# Patient Record
Sex: Male | Born: 1940 | Race: White | Hispanic: No | Marital: Married | Smoking: Former smoker
Health system: Southern US, Community
[De-identification: ages and names within clinical notes are randomized; demographics above are authoritative.]

## PROBLEM LIST (undated history)

## (undated) DIAGNOSIS — E785 Hyperlipidemia, unspecified: Secondary | ICD-10-CM

## (undated) DIAGNOSIS — M67442 Ganglion, left hand: Secondary | ICD-10-CM

## (undated) DIAGNOSIS — N400 Enlarged prostate without lower urinary tract symptoms: Secondary | ICD-10-CM

## (undated) HISTORY — DX: Benign prostatic hyperplasia without lower urinary tract symptoms: N40.0

## (undated) HISTORY — PX: TONSILLECTOMY: SUR1361

---

## 2014-08-30 ENCOUNTER — Emergency Department: Payer: Self-pay | Admitting: Emergency Medicine

## 2014-08-30 LAB — TROPONIN I

## 2014-08-30 LAB — URINALYSIS, COMPLETE
BLOOD: NEGATIVE
Bilirubin,UR: NEGATIVE
GLUCOSE, UR: NEGATIVE mg/dL (ref 0–75)
LEUKOCYTE ESTERASE: NEGATIVE
Nitrite: NEGATIVE
PH: 6 (ref 4.5–8.0)
PROTEIN: NEGATIVE
SPECIFIC GRAVITY: 1.021 (ref 1.003–1.030)
Squamous Epithelial: NONE SEEN
WBC UR: 4 /HPF (ref 0–5)

## 2014-08-30 LAB — CBC WITH DIFFERENTIAL/PLATELET
BASOS ABS: 0 10*3/uL (ref 0.0–0.1)
BASOS PCT: 0.5 %
EOS ABS: 0.1 10*3/uL (ref 0.0–0.7)
Eosinophil %: 0.8 %
HCT: 45.1 % (ref 40.0–52.0)
HGB: 14.9 g/dL (ref 13.0–18.0)
LYMPHS ABS: 1.3 10*3/uL (ref 1.0–3.6)
Lymphocyte %: 14.4 %
MCH: 29.4 pg (ref 26.0–34.0)
MCHC: 33 g/dL (ref 32.0–36.0)
MCV: 89 fL (ref 80–100)
MONO ABS: 0.7 x10 3/mm (ref 0.2–1.0)
MONOS PCT: 8 %
NEUTROS ABS: 7.1 10*3/uL — AB (ref 1.4–6.5)
Neutrophil %: 76.3 %
Platelet: 272 10*3/uL (ref 150–440)
RBC: 5.06 10*6/uL (ref 4.40–5.90)
RDW: 13.1 % (ref 11.5–14.5)
WBC: 9.3 10*3/uL (ref 3.8–10.6)

## 2014-08-30 LAB — BASIC METABOLIC PANEL
ANION GAP: 8 (ref 7–16)
BUN: 21 mg/dL — AB (ref 7–18)
CALCIUM: 8.2 mg/dL — AB (ref 8.5–10.1)
Chloride: 104 mmol/L (ref 98–107)
Co2: 27 mmol/L (ref 21–32)
Creatinine: 0.94 mg/dL (ref 0.60–1.30)
EGFR (African American): >60
Glucose: 113 mg/dL — ABNORMAL HIGH (ref 65–99)
OSMOLALITY: 281 (ref 275–301)
POTASSIUM: 3.8 mmol/L (ref 3.5–5.1)
SODIUM: 139 mmol/L (ref 136–145)

## 2015-02-21 ENCOUNTER — Other Ambulatory Visit: Payer: Self-pay | Admitting: Internal Medicine

## 2015-02-21 DIAGNOSIS — M25562 Pain in left knee: Secondary | ICD-10-CM

## 2015-03-01 ENCOUNTER — Ambulatory Visit
Admission: RE | Admit: 2015-03-01 | Discharge: 2015-03-01 | Disposition: A | Discharge status: Home / Self Care | Payer: Medicare Other | Source: Ambulatory Visit | Attending: Internal Medicine | Admitting: Internal Medicine

## 2015-03-01 DIAGNOSIS — S83242A Other tear of medial meniscus, current injury, left knee, initial encounter: Secondary | ICD-10-CM | POA: Diagnosis not present

## 2015-03-01 DIAGNOSIS — G8929 Other chronic pain: Secondary | ICD-10-CM | POA: Diagnosis present

## 2015-03-01 DIAGNOSIS — M25562 Pain in left knee: Secondary | ICD-10-CM

## 2015-03-01 DIAGNOSIS — M1712 Unilateral primary osteoarthritis, left knee: Secondary | ICD-10-CM | POA: Insufficient documentation

## 2015-03-01 DIAGNOSIS — S83282A Other tear of lateral meniscus, current injury, left knee, initial encounter: Secondary | ICD-10-CM | POA: Insufficient documentation

## 2015-09-14 ENCOUNTER — Ambulatory Visit: Payer: Medicare Other | Admitting: Anesthesiology

## 2015-09-14 ENCOUNTER — Ambulatory Visit
Admission: RE | Admit: 2015-09-14 | Discharge: 2015-09-14 | Disposition: A | Discharge status: Home / Self Care | Payer: Medicare Other | Source: Ambulatory Visit | Attending: Gastroenterology | Admitting: Gastroenterology

## 2015-09-14 ENCOUNTER — Encounter: Payer: Self-pay | Admitting: *Deleted

## 2015-09-14 ENCOUNTER — Encounter
Admission: RE | Disposition: A | Discharge status: Home / Self Care | Payer: Self-pay | Source: Ambulatory Visit | Attending: Gastroenterology

## 2015-09-14 DIAGNOSIS — K640 First degree hemorrhoids: Secondary | ICD-10-CM | POA: Insufficient documentation

## 2015-09-14 DIAGNOSIS — D120 Benign neoplasm of cecum: Secondary | ICD-10-CM | POA: Insufficient documentation

## 2015-09-14 DIAGNOSIS — Z79899 Other long term (current) drug therapy: Secondary | ICD-10-CM | POA: Insufficient documentation

## 2015-09-14 DIAGNOSIS — Z87891 Personal history of nicotine dependence: Secondary | ICD-10-CM | POA: Diagnosis not present

## 2015-09-14 DIAGNOSIS — Z1211 Encounter for screening for malignant neoplasm of colon: Secondary | ICD-10-CM | POA: Diagnosis present

## 2015-09-14 DIAGNOSIS — K5730 Diverticulosis of large intestine without perforation or abscess without bleeding: Secondary | ICD-10-CM | POA: Insufficient documentation

## 2015-09-14 DIAGNOSIS — E785 Hyperlipidemia, unspecified: Secondary | ICD-10-CM | POA: Insufficient documentation

## 2015-09-14 HISTORY — PX: COLONOSCOPY WITH PROPOFOL: SHX5780

## 2015-09-14 HISTORY — DX: Ganglion, left hand: M67.442

## 2015-09-14 HISTORY — DX: Hyperlipidemia, unspecified: E78.5

## 2015-09-14 SURGERY — COLONOSCOPY WITH PROPOFOL
Anesthesia: General

## 2015-09-14 MED ORDER — FENTANYL CITRATE (PF) 100 MCG/2ML IJ SOLN
INTRAMUSCULAR | Status: DC | PRN
  Administered 2015-09-14: 50 ug via INTRAVENOUS

## 2015-09-14 MED ORDER — PROPOFOL 500 MG/50ML IV EMUL
INTRAVENOUS | Status: DC | PRN
  Administered 2015-09-14: 140 ug/kg/min via INTRAVENOUS

## 2015-09-14 MED ORDER — PROPOFOL 500 MG/50ML IV EMUL: INTRAVENOUS | Status: DC | PRN

## 2015-09-14 MED ORDER — SODIUM CHLORIDE 0.9 % IV SOLN
INTRAVENOUS | Status: DC
  Administered 2015-09-14: 1000 mL via INTRAVENOUS
  Administered 2015-09-14: 08:00:00 via INTRAVENOUS

## 2015-09-14 MED ORDER — MIDAZOLAM HCL 5 MG/5ML IJ SOLN
INTRAMUSCULAR | Status: DC | PRN
  Administered 2015-09-14: 1 mg via INTRAVENOUS

## 2015-09-14 MED ORDER — PROPOFOL 10 MG/ML IV BOLUS
INTRAVENOUS | Status: DC | PRN
  Administered 2015-09-14: 30 mg via INTRAVENOUS

## 2015-09-14 NOTE — Anesthesia Postprocedure Evaluation (Signed)
Anesthesia Post Note  Patient: Dalton Franklin  Procedure(s) Performed: Procedure(s) (LRB): COLONOSCOPY WITH PROPOFOL (N/A)  Patient location during evaluation: PACU Anesthesia Type: General Level of consciousness: awake Pain management: pain level controlled Vital Signs Assessment: post-procedure vital signs reviewed and stable Respiratory status: spontaneous breathing Cardiovascular status: stable Anesthetic complications: no    Last Vitals:  Filed Vitals:   09/14/15 0850 09/14/15 0900  BP: 110/76 118/70  Pulse: 66 64  Temp:    Resp: 13 13    Last Pain: There were no vitals filed for this visit.               VAN STAVEREN,Dalton Franklin

## 2015-09-14 NOTE — Anesthesia Procedure Notes (Signed)
Date/Time: 09/14/2015 8:05 AM Performed by: Allean Found Pre-anesthesia Checklist: Patient identified, Emergency Drugs available, Suction available, Patient being monitored and Timeout performed Oxygen Delivery Method: Nasal cannula

## 2015-09-14 NOTE — Op Note (Signed)
[facility] Gastroenterology Patient Name: Dalton Franklin Procedure Date: 09/14/2015 8:08 AM MRN: [id number] Account #: [id number] Date of Birth: 12-12-1940 Admit Type: Outpatient Age: 74 Room: [facility] ENDO ROOM 3 Gender: Male Note Status: Finalized Procedure:         Colonoscopy Indications:       High risk colon cancer surveillance: Personal history of                     non-advanced adenoma, Last colonoscopy: 2011 Patient Profile:   This is a 74 year old male. Providers:         Gerrit Heck. Rayann Heman, MD Referring MD:      Leonie Douglas. Doy Hutching, MD (Referring MD) Medicines:         Propofol per Anesthesia Complications:     No immediate complications. Procedure:         Pre-Anesthesia Assessment:                    - Prior to the procedure, a History and Physical was                     performed, and patient medications, allergies and                     sensitivities were reviewed. The patient's tolerance of                     previous anesthesia was reviewed.                    - Prior to the procedure, a History and Physical was                     performed, and patient medications, allergies and                     sensitivities were reviewed. The patient's tolerance of                     previous anesthesia was reviewed.                    After obtaining informed consent, the colonoscope was                     passed under direct vision. Throughout the procedure, the                     patient's blood pressure, pulse, and oxygen saturations                     were monitored continuously. The Colonoscope was                     introduced through the anus and advanced to the the cecum,                     identified by appendiceal orifice and ileocecal valve. The                     colonoscopy was performed without difficulty. The patient                     tolerated the procedure well. The quality of the bowel  preparation was  excellent. Findings:      The perianal and digital rectal examinations were normal.      A 4 mm polyp was found in the cecum. The polyp was sessile. The polyp       was removed with a cold snare. Resection and retrieval were complete.      A few small and large-mouthed diverticula were found in the sigmoid       colon.      Internal hemorrhoids were found during retroflexion. The hemorrhoids       were Grade I (internal hemorrhoids that do not prolapse).      The exam was otherwise without abnormality. Impression:        - One 4 mm polyp in the cecum. Resected and retrieved.                    - Diverticulosis in the sigmoid colon.                    - Internal hemorrhoids.                    - The examination was otherwise normal. Recommendation:    - Observe patient in GI recovery unit.                    - Continue present medications.                    - Await pathology results.                    - Repeat colonoscopy in 5 years for surveillance.                    - Return to referring physician.                    - The findings and recommendations were discussed with the                     patient.                    - The findings and recommendations were discussed with the                     patient's family. Procedure Code(s): --- Professional ---                    [phone], Colonoscopy, flexible; with removal of tumor(s),                     polyp(s), or other lesion(s) by snare technique Diagnosis Code(s): --- Professional ---                    Z86.010, Personal history of colonic polyps                    D12.0, Benign neoplasm of cecum                    K64.0, First degree hemorrhoids                    K57.30, Diverticulosis of large intestine without                     perforation or abscess without bleeding  CPT copyright 2014 American Medical Association. All rights reserved. The codes documented in this report are preliminary and upon coder review may  be  revised to meet current compliance requirements. Mellody Life, MD 09/14/2015 8:36:46 AM This report has been signed electronically. Number of Addenda: 0 Note Initiated On: 09/14/2015 8:08 AM Scope Withdrawal Time: 0 hours 13 minutes 7 seconds  Total Procedure Duration: 0 hours 17 minutes 23 seconds       [facility]

## 2015-09-14 NOTE — Discharge Instructions (Signed)

## 2015-09-14 NOTE — Anesthesia Postprocedure Evaluation (Signed)
Anesthesia Post Note  Patient: Dalton Franklin  Procedure(s) Performed: Procedure(s) (LRB): COLONOSCOPY WITH PROPOFOL (N/A)  Patient location during evaluation: PACU Anesthesia Type: General Level of consciousness: awake Pain management: satisfactory to patient Vital Signs Assessment: post-procedure vital signs reviewed and stable Respiratory status: spontaneous breathing Cardiovascular status: stable Anesthetic complications: no    Last Vitals:  Filed Vitals:   09/14/15 0850 09/14/15 0900  BP: 110/76 118/70  Pulse: 66 64  Temp:    Resp: 13 13    Last Pain: There were no vitals filed for this visit.               VAN STAVEREN,Arriana Lohmann

## 2015-09-14 NOTE — Transfer of Care (Signed)
Immediate Anesthesia Transfer of Care Note  Patient: Dalton Franklin  Procedure(s) Performed: Procedure(s): COLONOSCOPY WITH PROPOFOL (N/A)  Patient Location: PACU  Anesthesia Type:General  Level of Consciousness: sedated  Airway & Oxygen Therapy: Patient Spontanous Breathing and Patient connected to nasal cannula oxygen  Post-op Assessment: Report given to RN and Post -op Vital signs reviewed and stable  Post vital signs: Reviewed and stable  Last Vitals:  Filed Vitals:   09/14/15 0730  BP: 157/90  Pulse: 81  Temp: 36.7 C  Resp: 18    Complications: No apparent anesthesia complications

## 2015-09-14 NOTE — H&P (Signed)
  Primary Care Physician:  Idelle Crouch, MD  Pre-Procedure History & Physical: HPI:  Dalton Franklin is a 74 y.o. male is here for an colonoscopy.   Past Medical History  Diagnosis Date  . Hyperlipidemia   . Ganglion cyst of flexor tendon sheath of finger of left hand     Past Surgical History  Procedure Laterality Date  . Tonsillectomy      Prior to Admission medications   Medication Sig Start Date End Date Taking? Authorizing Provider  atorvastatin (LIPITOR) 20 MG tablet Take 20 mg by mouth daily.   Yes Historical Provider, MD  ibuprofen (ADVIL,MOTRIN) 200 MG tablet Take 200 mg by mouth every 6 (six) hours as needed.   Yes Historical Provider, MD  niacin (NIASPAN) 1000 MG CR tablet Take 1,000 mg by mouth at bedtime.   Yes Historical Provider, MD    Allergies as of 08/14/2015  . (Not on File)    History reviewed. No pertinent family history.  Social History   Social History  . Marital Status: Married    Spouse Name: N/A  . Number of Children: N/A  . Years of Education: N/A   Occupational History  . Not on file.   Social History Main Topics  . Smoking status: Former Research scientist (life sciences)  . Smokeless tobacco: Not on file  . Alcohol Use: Not on file  . Drug Use: Not on file  . Sexual Activity: Not on file   Other Topics Concern  . Not on file   Social History Narrative  . No narrative on file     Physical Exam: BP 157/90 mmHg  Pulse 81  Temp(Src) 98.1 F (36.7 C) (Oral)  Resp 18  Ht 5\' 11"  (1.803 m)  Wt 83.915 kg (185 lb)  BMI 25.81 kg/m2  SpO2 97% General:   Alert,  pleasant and cooperative in NAD Head:  Normocephalic and atraumatic. Neck:  Supple; no masses or thyromegaly. Lungs:  Clear throughout to auscultation.    Heart:  Regular rate and rhythm. Abdomen:  Soft, nontender and nondistended. Normal bowel sounds, without guarding, and without rebound.   Neurologic:  Alert and  oriented x4;  grossly normal neurologically.  Impression/Plan: Dalton Franklin  is here for an colonoscopy to be performed for polyp surveillance  Risks, benefits, limitations, and alternatives regarding  colonoscopy have been reviewed with the patient.  Questions have been answered.  All parties agreeable.   Josefine Class, MD  09/14/2015, 8:06 AM

## 2015-09-14 NOTE — Anesthesia Preprocedure Evaluation (Signed)
Anesthesia Evaluation  Patient identified by MRN, date of birth, ID band Patient awake    Reviewed: Allergy & Precautions, NPO status , Patient's Chart, lab work & pertinent test results  Airway Mallampati: II       Dental  (+) Teeth Intact   Pulmonary neg pulmonary ROS, former smoker,    Pulmonary exam normal        Cardiovascular negative cardio ROS   Rhythm:Regular     Neuro/Psych    GI/Hepatic negative GI ROS, Neg liver ROS,   Endo/Other  negative endocrine ROS  Renal/GU negative Renal ROS     Musculoskeletal   Abdominal Normal abdominal exam  (+)   Peds negative pediatric ROS (+)  Hematology negative hematology ROS (+)   Anesthesia Other Findings   Reproductive/Obstetrics                             Anesthesia Physical Anesthesia Plan  ASA: I  Anesthesia Plan: General   Post-op Pain Management:    Induction: Intravenous  Airway Management Planned: Nasal Cannula  Additional Equipment:   Intra-op Plan:   Post-operative Plan:   Informed Consent: I have reviewed the patients History and Physical, chart, labs and discussed the procedure including the risks, benefits and alternatives for the proposed anesthesia with the patient or authorized representative who has indicated his/her understanding and acceptance.     Plan Discussed with: CRNA  Anesthesia Plan Comments:         Anesthesia Quick Evaluation

## 2015-09-17 ENCOUNTER — Encounter: Payer: Self-pay | Admitting: Gastroenterology

## 2015-09-17 LAB — SURGICAL PATHOLOGY

## 2015-10-26 IMAGING — CR ORBITS FOR FOREIGN BODY - 2 VIEW
1 series · 2 of 2 positions shown · non-contrast
Comparison: None.

CLINICAL DATA: Metal working/exposure; clearance prior to MRI

EXAM:
ORBITS FOR FOREIGN BODY - 2 VIEW

[Series 1: dxr orbits for mri clearance · 0.14mm/px · 2 of 2 slices shown]
[im 1/2]
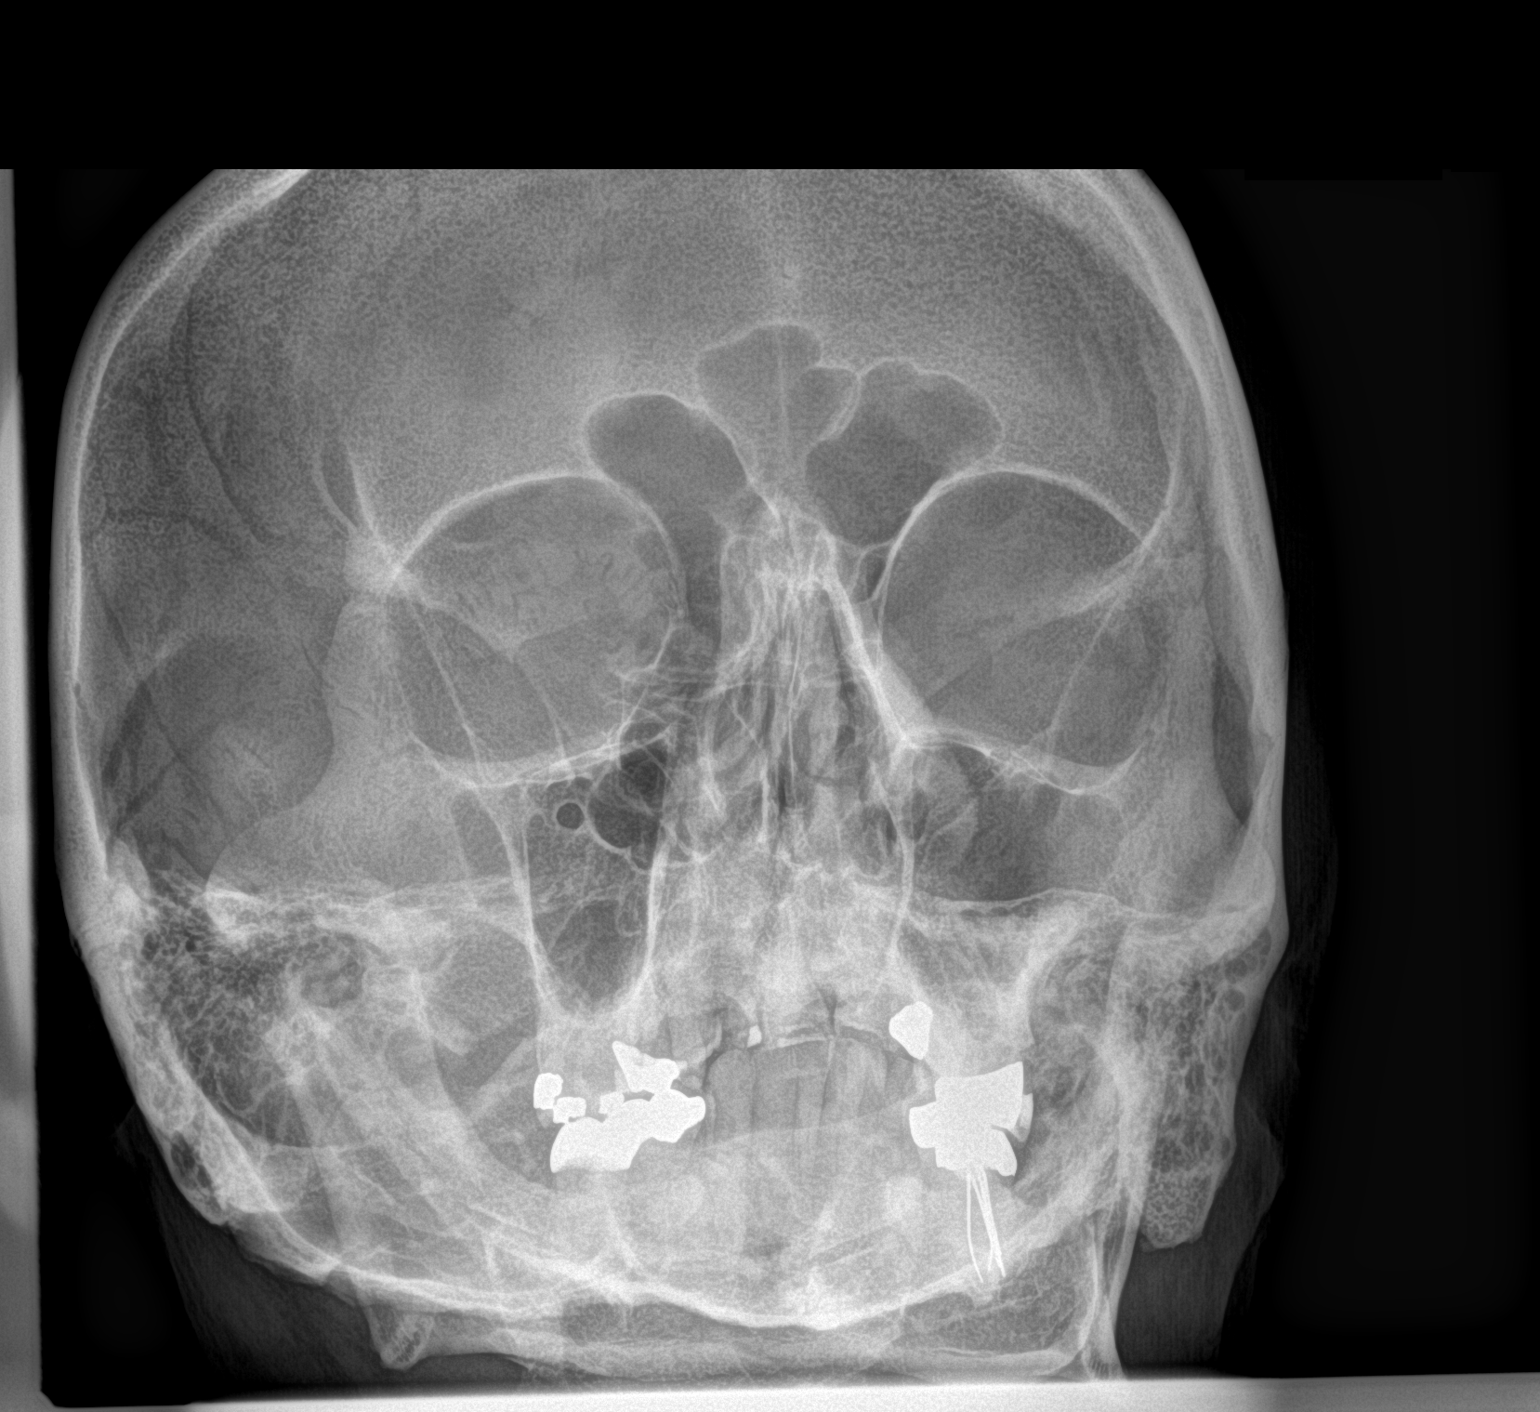
[im 2/2]
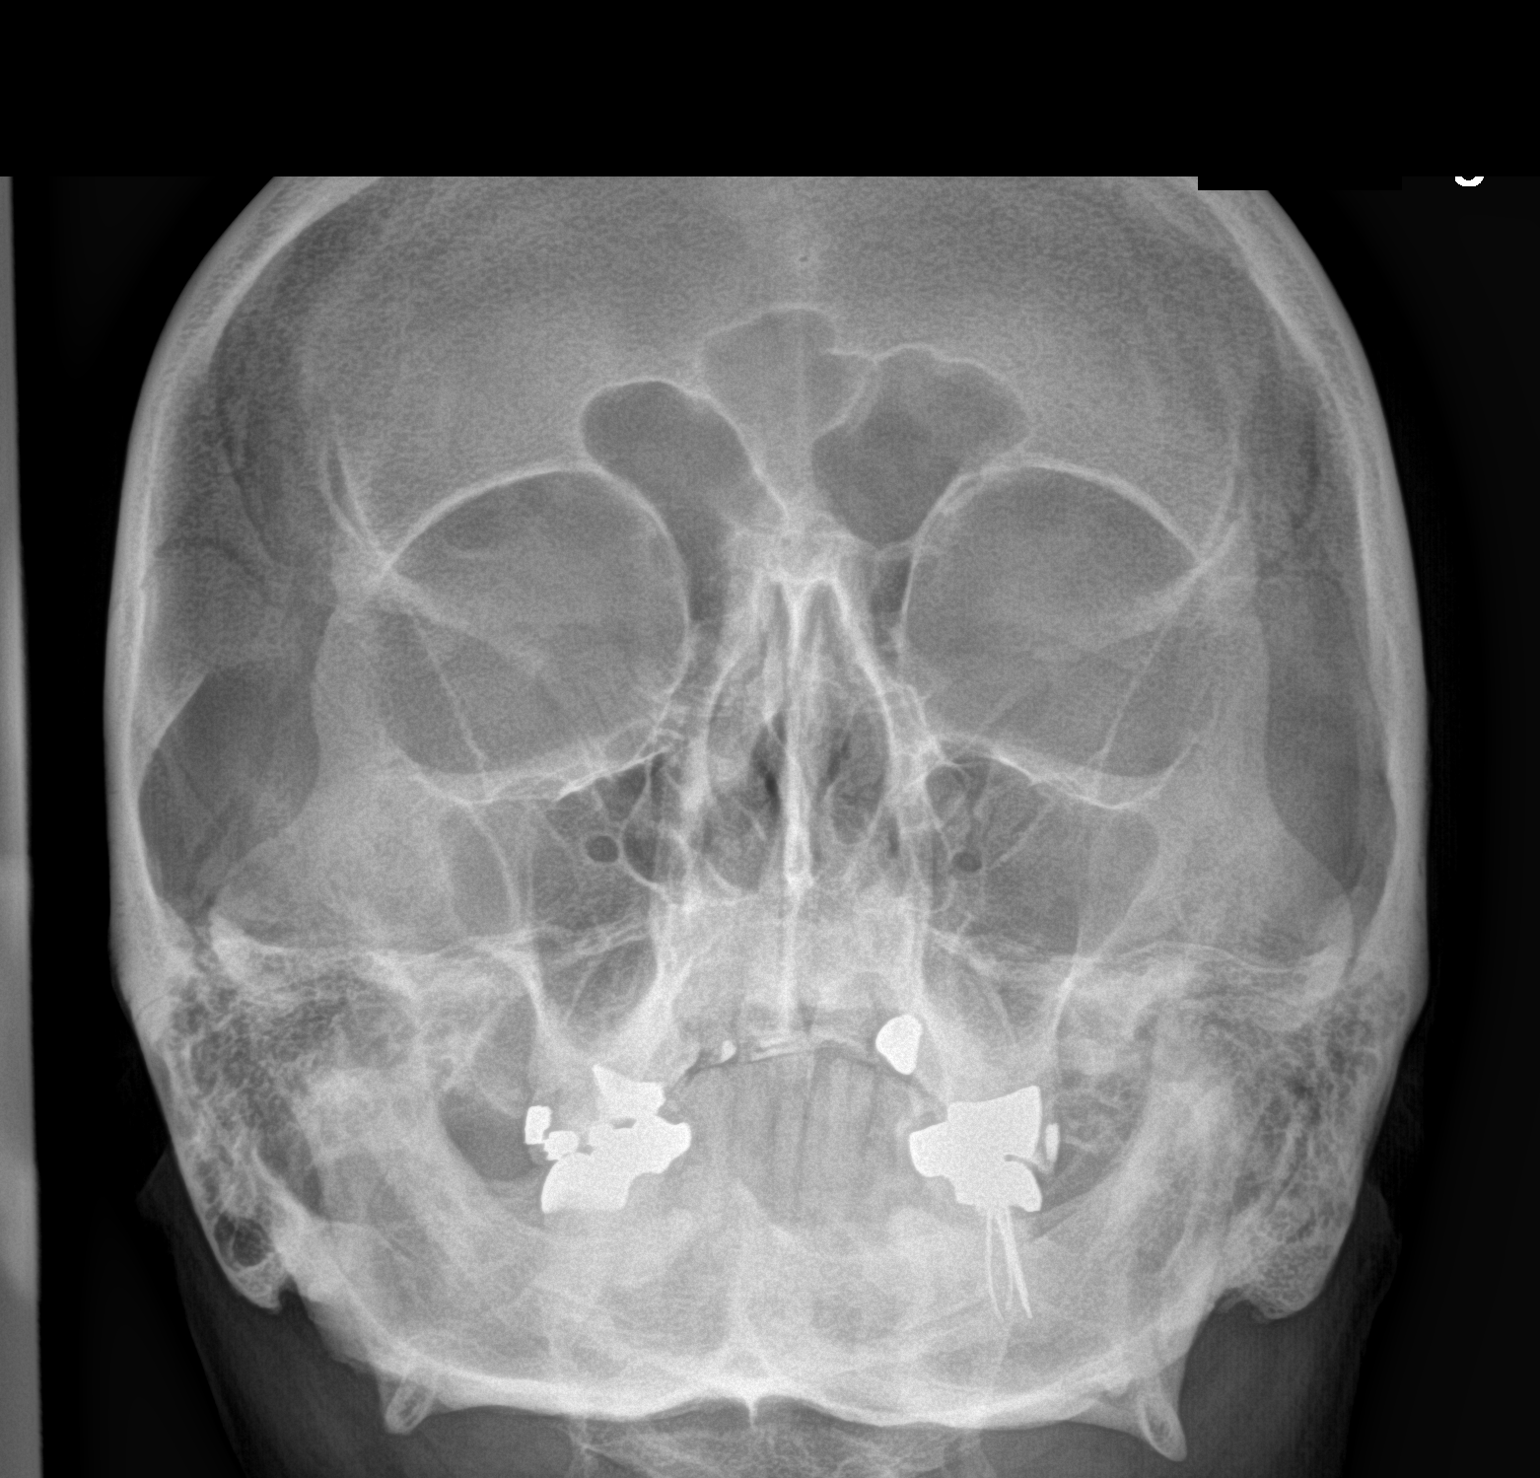

[2 of 2 positions shown; findings below may reference images not displayed]

FINDINGS: There is no evidence of metallic foreign body within the orbits. No
significant bone abnormality identified.
IMPRESSION: No evidence of metallic foreign body within the orbits.

## 2016-07-25 DIAGNOSIS — M75122 Complete rotator cuff tear or rupture of left shoulder, not specified as traumatic: Secondary | ICD-10-CM | POA: Insufficient documentation

## 2016-07-25 DIAGNOSIS — M7582 Other shoulder lesions, left shoulder: Secondary | ICD-10-CM | POA: Insufficient documentation

## 2016-08-31 ENCOUNTER — Emergency Department
Admission: EM | Admit: 2016-08-31 | Discharge: 2016-08-31 | Disposition: A | Discharge status: Home / Self Care | Payer: Medicare Other | Attending: Emergency Medicine | Admitting: Emergency Medicine

## 2016-08-31 DIAGNOSIS — Z791 Long term (current) use of non-steroidal anti-inflammatories (NSAID): Secondary | ICD-10-CM | POA: Diagnosis not present

## 2016-08-31 DIAGNOSIS — R339 Retention of urine, unspecified: Secondary | ICD-10-CM | POA: Insufficient documentation

## 2016-08-31 DIAGNOSIS — Z87891 Personal history of nicotine dependence: Secondary | ICD-10-CM | POA: Diagnosis not present

## 2016-08-31 DIAGNOSIS — R1030 Lower abdominal pain, unspecified: Secondary | ICD-10-CM | POA: Diagnosis not present

## 2016-08-31 LAB — URINALYSIS COMPLETE WITH MICROSCOPIC (ARMC ONLY)
BACTERIA UA: NONE SEEN
Bilirubin Urine: NEGATIVE
Glucose, UA: NEGATIVE mg/dL
Hgb urine dipstick: NEGATIVE
Leukocytes, UA: NEGATIVE
Nitrite: NEGATIVE
PROTEIN: NEGATIVE mg/dL
Specific Gravity, Urine: 1.012 (ref 1.005–1.030)
Squamous Epithelial / LPF: NONE SEEN
pH: 5 (ref 5.0–8.0)

## 2016-08-31 LAB — CBC WITH DIFFERENTIAL/PLATELET
BASOS ABS: 0 10*3/uL (ref 0–0.1)
Basophils Relative: 0 %
Eosinophils Absolute: 0.2 10*3/uL (ref 0–0.7)
Eosinophils Relative: 2 %
HEMATOCRIT: 43.3 % (ref 40.0–52.0)
HEMOGLOBIN: 15 g/dL (ref 13.0–18.0)
LYMPHS PCT: 13 %
Lymphs Abs: 1.3 10*3/uL (ref 1.0–3.6)
MCH: 29.4 pg (ref 26.0–34.0)
MCHC: 34.6 g/dL (ref 32.0–36.0)
MCV: 85.1 fL (ref 80.0–100.0)
Monocytes Absolute: 1.3 10*3/uL — ABNORMAL HIGH (ref 0.2–1.0)
Monocytes Relative: 12 %
NEUTROS ABS: 7.5 10*3/uL — AB (ref 1.4–6.5)
NEUTROS PCT: 73 %
Platelets: 224 10*3/uL (ref 150–440)
RBC: 5.09 MIL/uL (ref 4.40–5.90)
RDW: 13.4 % (ref 11.5–14.5)
WBC: 10.4 10*3/uL (ref 3.8–10.6)

## 2016-08-31 LAB — BASIC METABOLIC PANEL
ANION GAP: 7 (ref 5–15)
BUN: 21 mg/dL — ABNORMAL HIGH (ref 6–20)
CO2: 24 mmol/L (ref 22–32)
Calcium: 9.6 mg/dL (ref 8.9–10.3)
Chloride: 104 mmol/L (ref 101–111)
Creatinine, Ser: 1.17 mg/dL (ref 0.61–1.24)
GFR calc Af Amer: >60 mL/min (ref >60)
GFR calc non Af Amer: 59 mL/min — ABNORMAL LOW (ref >60)
GLUCOSE: 77 mg/dL (ref 65–99)
POTASSIUM: 4.2 mmol/L (ref 3.5–5.1)
Sodium: 135 mmol/L (ref 135–145)

## 2016-08-31 MED ORDER — TAMSULOSIN HCL 0.4 MG PO CAPS: 0.4000 mg | ORAL_CAPSULE | Freq: Every day | ORAL | 0 refills | Status: DC

## 2016-08-31 NOTE — ED Notes (Signed)
Patient reports that he is having trouble with urination. Patient states that he is only "dribbleing" when he uses the bathroom. Patient recently taking decongestants for cold symptoms.

## 2016-08-31 NOTE — ED Provider Notes (Signed)
[facility] Emergency Department Provider Note  ____________________________________________   First MD Initiated Contact with Patient 08/31/16 [phone]     (approximate)  I have reviewed the triage vital signs and the nursing notes.   HISTORY  Chief Complaint Dysuria and Flank Pain   HPI Dalton Franklin is a 75 y.o. male with a history of hyperlipidemia who is presenting to the emergency department with difficulty with urination. He says that he has only been able to "dribble" urine since this Friday after taking Claritin-D. He says that something similar happen to him about a month ago after taking Mucinex. He says that he feels a fullness to his lower abdomen which has become increasingly painful. He denies any flank pain to me. Denies any fever. Denies any nausea vomiting or diarrhea. Denies any history of enlarged prostate and says that he has had a recent workup by his primary care doctor with a normal rectal exam as well as a low PSA.   Past Medical History:  Diagnosis Date  . Ganglion cyst of flexor tendon sheath of finger of left hand   . Hyperlipidemia     There are no active problems to display for this patient.   Past Surgical History:  Procedure Laterality Date  . COLONOSCOPY WITH PROPOFOL N/A 09/14/2015   Procedure: COLONOSCOPY WITH PROPOFOL;  Surgeon: Josefine Class, MD;  Location: [facility] ENDOSCOPY;  Service: Endoscopy;  Laterality: N/A;  . TONSILLECTOMY      Prior to Admission medications   Medication Sig Start Date End Date Taking? Authorizing Provider  atorvastatin (LIPITOR) 20 MG tablet Take 20 mg by mouth daily.    Historical Provider, MD  ibuprofen (ADVIL,MOTRIN) 200 MG tablet Take 200 mg by mouth every 6 (six) hours as needed.    Historical Provider, MD  niacin (NIASPAN) 1000 MG CR tablet Take 1,000 mg by mouth at bedtime.    Historical Provider, MD    Allergies Meloxicam; Other; and Soma [carisoprodol]  No family history on  file.  Social History Social History  Substance Use Topics  . Smoking status: Former Research scientist (life sciences)  . Smokeless tobacco: Not on file  . Alcohol use Not on file    Review of Systems Constitutional: No fever/chills Eyes: No visual changes. ENT: No sore throat. Cardiovascular: Denies chest pain. Respiratory: Denies shortness of breath. Gastrointestinal:   No nausea, no vomiting.  No diarrhea.  No constipation. Genitourinary: As above Musculoskeletal: Negative for back pain. Skin: Negative for rash. Neurological: Negative for headaches, focal weakness or numbness.  10-point ROS otherwise negative.  ____________________________________________   PHYSICAL EXAM:  VITAL SIGNS: ED Triage Vitals  Enc Vitals Group     BP 08/31/16 0908 (!) 152/89     Pulse Rate 08/31/16 0908 92     Resp 08/31/16 0908 18     Temp 08/31/16 0908 97.5 F (36.4 C)     Temp src --      SpO2 08/31/16 0908 97 %     Weight 08/31/16 0910 185 lb (83.9 kg)     Height 08/31/16 0910 5\' 11"  (1.803 m)     Head Circumference --      Peak Flow --      Pain Score 08/31/16 0913 8     Pain Loc --      Pain Edu? --      Excl. in [place]? --     Constitutional: Alert and oriented. Well appearing and in no acute distress. Eyes: Conjunctivae are normal.  PERRL. EOMI. Head: Atraumatic. Nose: No congestion/rhinnorhea. Mouth/Throat: Mucous membranes are moist.   Neck: No stridor.   Cardiovascular: Normal rate, regular rhythm. Grossly normal heart sounds.   Respiratory: Normal respiratory effort.  No retractions. Lungs CTAB. Gastrointestinal: Soft With suprapubic fullness as well as mild tenderness palpation. No distention. No CVA tenderness. Musculoskeletal: No lower extremity tenderness nor edema.   Neurologic:  Normal speech and language. No gross focal neurologic deficits are appreciated.  Skin:  Skin is warm, dry and intact. No rash noted. Psychiatric: Mood and affect are normal. Speech and behavior are  normal.  ____________________________________________   LABS (all labs ordered are listed, but only abnormal results are displayed)  Labs Reviewed  URINALYSIS COMPLETEWITH MICROSCOPIC ([place]) - Abnormal; Notable for the following:       Result Value   Color, Urine YELLOW (*)    APPearance CLEAR (*)    Ketones, ur TRACE (*)    All other components within normal limits  CBC WITH DIFFERENTIAL/PLATELET - Abnormal; Notable for the following:    Neutro Abs 7.5 (*)    Monocytes Absolute 1.3 (*)    All other components within normal limits  BASIC METABOLIC PANEL - Abnormal; Notable for the following:    BUN 21 (*)    GFR calc non Af Amer 59 (*)    All other components within normal limits   ____________________________________________  EKG   ____________________________________________  RADIOLOGY   ____________________________________________   PROCEDURES  Procedure(s) performed:   Procedures  Critical Care performed:   ____________________________________________   INITIAL IMPRESSION / ASSESSMENT AND PLAN / ED COURSE  Pertinent labs & imaging results that were available during my care of the patient were reviewed by me and considered in my medical decision making (see chart for details).   Clinical Course     Patient likely with urinary retention secondary antihistamine use. I discussed with him using a nasal steroid if he has further congestion such as Flonase. ____________________________________________  ----------------------------------------- 10:44 AM on 08/31/2016 -----------------------------------------  Patient with very quick return of 700 cc of urine through the Foley. He says that he feels much improved. Reassuring labs without any signs of kidney injury. Normal white blood cell count. We'll discharge with Flomax. He understands follow up with urologist in about one week for removal of the catheter.    FINAL CLINICAL IMPRESSION(S) / ED  DIAGNOSES  Urinary retention secondary to antihistamines.    NEW MEDICATIONS STARTED DURING THIS VISIT:  New Prescriptions   No medications on file     Note:  This document was prepared using Dragon voice recognition software and may include unintentional dictation errors.    Orbie Pyo, MD 08/31/16 1046

## 2016-08-31 NOTE — ED Triage Notes (Signed)
Pt reports frequency, urgency, dribbling urine and flank pain worse upon standing for several days. Pt denies fever at home. Denies nausea, vomiting or diarrhea.

## 2016-08-31 NOTE — ED Notes (Signed)
Bladder scan = >999 mL  Dr. Clearnce Hasten notified

## 2016-10-03 ENCOUNTER — Encounter: Payer: Self-pay | Admitting: Urology

## 2016-10-03 ENCOUNTER — Telehealth: Payer: Self-pay

## 2016-10-03 ENCOUNTER — Ambulatory Visit (INDEPENDENT_AMBULATORY_CARE_PROVIDER_SITE_OTHER): Payer: Medicare Other | Admitting: Urology

## 2016-10-03 VITALS — BP 149/92 | HR 71 | Ht 71.0 in | Wt 195.9 lb

## 2016-10-03 DIAGNOSIS — E559 Vitamin D deficiency, unspecified: Secondary | ICD-10-CM | POA: Insufficient documentation

## 2016-10-03 DIAGNOSIS — N401 Enlarged prostate with lower urinary tract symptoms: Secondary | ICD-10-CM

## 2016-10-03 DIAGNOSIS — M6740 Ganglion, unspecified site: Secondary | ICD-10-CM | POA: Insufficient documentation

## 2016-10-03 DIAGNOSIS — R338 Other retention of urine: Secondary | ICD-10-CM

## 2016-10-03 DIAGNOSIS — M25512 Pain in left shoulder: Secondary | ICD-10-CM

## 2016-10-03 DIAGNOSIS — Z86010 Personal history of colonic polyps: Secondary | ICD-10-CM | POA: Insufficient documentation

## 2016-10-03 DIAGNOSIS — R339 Retention of urine, unspecified: Secondary | ICD-10-CM

## 2016-10-03 DIAGNOSIS — G8929 Other chronic pain: Secondary | ICD-10-CM | POA: Insufficient documentation

## 2016-10-03 DIAGNOSIS — E785 Hyperlipidemia, unspecified: Secondary | ICD-10-CM | POA: Insufficient documentation

## 2016-10-03 LAB — URINALYSIS, COMPLETE
Bilirubin, UA: NEGATIVE
Glucose, UA: NEGATIVE
Ketones, UA: NEGATIVE
Leukocytes, UA: NEGATIVE
NITRITE UA: NEGATIVE
PH UA: 5.5 (ref 5.0–7.5)
PROTEIN UA: NEGATIVE
RBC, UA: NEGATIVE
Specific Gravity, UA: 1.015 (ref 1.005–1.030)
UUROB: 0.2 mg/dL (ref 0.2–1.0)

## 2016-10-03 LAB — MICROSCOPIC EXAMINATION: Epithelial Cells (non renal): NONE SEEN /hpf (ref 0–10)

## 2016-10-03 LAB — BLADDER SCAN AMB NON-IMAGING: SCAN RESULT: 870

## 2016-10-03 MED ORDER — FINASTERIDE 5 MG PO TABS: 5.0000 mg | ORAL_TABLET | Freq: Every day | ORAL | 11 refills | Status: DC

## 2016-10-03 MED ORDER — TAMSULOSIN HCL 0.4 MG PO CAPS: 0.8000 mg | ORAL_CAPSULE | Freq: Every day | ORAL | 11 refills | Status: DC

## 2016-10-03 NOTE — Telephone Encounter (Signed)
Pt called stating he just had a catheter placed just a few minutes ago and now is having blood in his bag. Reinforced with pt this is normal and to drink more water. Reinforced with pt the more water drank the less blood he should see. Pt voiced understanding.

## 2016-10-03 NOTE — Progress Notes (Signed)
10/03/2016 9:38 AM   Dalton Franklin 1940/12/22 [id number]  Referring provider: Idelle Crouch, MD [facility] [place], [place] [address]  Chief Complaint  Patient presents with  . New Patient (Initial Visit)    urinary retention     HPI: The patient is a 75 year old man who has a history of urinary retention who presents with a chief complaint of not be able to control his urination. He was seen in the emergency department approximately one month ago when a Foley catheter was placed for 700 cc. This was removed his primary care provider's office a few days later.  He presents today with a PVR of 870 cc.  His only complaint is occasional incontinence. He wears a depends for this. It happens mostly at night. He feels that he empties his bladder and is currently in no discomfort. He states that his stream is strong. He denies hesitancy or urgency.   PMH: Past Medical History:  Diagnosis Date  . Ganglion cyst of flexor tendon sheath of finger of left hand   . Hyperlipidemia     Surgical History: Past Surgical History:  Procedure Laterality Date  . COLONOSCOPY WITH PROPOFOL N/A 09/14/2015   Procedure: COLONOSCOPY WITH PROPOFOL;  Surgeon: Josefine Class, MD;  Location: [facility] ENDOSCOPY;  Service: Endoscopy;  Laterality: N/A;  . TONSILLECTOMY      Home Medications:  Allergies as of 10/03/2016      Reactions   Meloxicam    Other    Antihistamines:  Urinary retention   Soma [carisoprodol]       Medication List       Accurate as of 10/03/16  9:38 AM. Always use your most recent med list.          atorvastatin 20 MG tablet Commonly known as:  LIPITOR Take 20 mg by mouth daily.   finasteride 5 MG tablet Commonly known as:  PROSCAR Take 1 tablet (5 mg total) by mouth daily.   ibuprofen 200 MG tablet Commonly known as:  ADVIL,MOTRIN Take 200 mg by mouth every 6 (six) hours as needed.   niacin 1000 MG CR tablet Commonly known as:   NIASPAN Take 1,000 mg by mouth at bedtime.   tamsulosin 0.4 MG Caps capsule Commonly known as:  FLOMAX Take 1 capsule (0.4 mg total) by mouth daily.   tamsulosin 0.4 MG Caps capsule Commonly known as:  FLOMAX Take 2 capsules (0.8 mg total) by mouth daily.       Allergies:  Allergies  Allergen Reactions  . Meloxicam   . Other     Antihistamines:  Urinary retention   . Soma [Carisoprodol]     Family History: Family History  Problem Relation Age of Onset  . Prostate cancer Neg Hx   . Kidney cancer Neg Hx   . Bladder Cancer Neg Hx     Social History:  reports that he has quit smoking. He has never used smokeless tobacco. He reports that he does not drink alcohol or use drugs.  ROS: UROLOGY Frequent Urination?: Yes Hard to postpone urination?: Yes Burning/pain with urination?: No Get up at night to urinate?: Yes Leakage of urine?: Yes Urine stream starts and stops?: No Trouble starting stream?: No Do you have to strain to urinate?: No Blood in urine?: No Urinary tract infection?: No Sexually transmitted disease?: No Injury to kidneys or bladder?: No Painful intercourse?: No Weak stream?: No Erection problems?: No Penile pain?: No  Gastrointestinal Nausea?: No  Vomiting?: No Indigestion/heartburn?: No Diarrhea?: No Constipation?: No  Constitutional Fever: No Night sweats?: No Weight loss?: No Fatigue?: No  Skin Skin rash/lesions?: No Itching?: No  Eyes Blurred vision?: No Double vision?: No  Ears/Nose/Throat Sore throat?: No Sinus problems?: No  Hematologic/Lymphatic Swollen glands?: No Easy bruising?: No  Cardiovascular Leg swelling?: No Chest pain?: No  Respiratory Cough?: No Shortness of breath?: No  Endocrine Excessive thirst?: No  Musculoskeletal Back pain?: No Joint pain?: No  Neurological Headaches?: No Dizziness?: No  Psychologic Depression?: No Anxiety?: No  Physical Exam: BP (!) 149/92   Pulse 71   Ht 5'  11" (1.803 m)   Wt 195 lb 14.4 oz (88.9 kg)   BMI 27.32 kg/m   Constitutional:  Alert and oriented, No acute distress. HEENT: [place] AT, moist mucus membranes.  Trachea midline, no masses. Cardiovascular: No clubbing, cyanosis, or edema. Respiratory: Normal respiratory effort, no increased work of breathing. GI: Abdomen is soft, nontender, nondistended, no abdominal masses GU: No CVA tenderness. Normal phallus. Testicles descended equally bilaterally benign. DRE: 2+ benign. Skin: No rashes, bruises or suspicious lesions. Lymph: No cervical or inguinal adenopathy. Neurologic: Grossly intact, no focal deficits, moving all 4 extremities. Psychiatric: Normal mood and affect.  Laboratory Data: Lab Results  Component Value Date   WBC 10.4 08/31/2016   HGB 15.0 08/31/2016   HCT 43.3 08/31/2016   MCV 85.1 08/31/2016   PLT 224 08/31/2016    Lab Results  Component Value Date   CREATININE 1.17 08/31/2016    No results found for: PSA  No results found for: TESTOSTERONE  No results found for: HGBA1C  Urinalysis    Component Value Date/Time   COLORURINE YELLOW (A) 08/31/2016 0915   APPEARANCEUR CLEAR (A) 08/31/2016 0915   APPEARANCEUR Clear 08/30/2014 1637   LABSPEC 1.012 08/31/2016 0915   LABSPEC 1.021 08/30/2014 1637   PHURINE 5.0 08/31/2016 0915   GLUCOSEU NEGATIVE 08/31/2016 0915   GLUCOSEU Negative 08/30/2014 1637   HGBUR NEGATIVE 08/31/2016 0915   BILIRUBINUR NEGATIVE 08/31/2016 0915   BILIRUBINUR Negative 08/30/2014 1637   KETONESUR TRACE (A) 08/31/2016 0915   PROTEINUR NEGATIVE 08/31/2016 0915   NITRITE NEGATIVE 08/31/2016 0915   LEUKOCYTESUR NEGATIVE 08/31/2016 0915   LEUKOCYTESUR Negative 08/30/2014 1637     Assessment & Plan:    1. BPH 2. Urinary retention I discussed with the patient that he is currently in urinary retention. He understands this puts him at risk for bladder decompensation, urinary tract infections, and renal failure. He understands  recommendation at this time would be to place a Foley catheter for a few weeks to allow the bladder to be decompressed. I also shared my concern with him the fact that he is relatively comfortable with such a large volume bladder may be a sign of bladder decompensation. I will increase his Flomax to 0.8 mg daily. We'll also start him on finasteride to allow him the best chance of passing a trial of void in the future. We'll see him back in a few weeks for trial of void. He may benefit from urodynamic studies in the future.   Return in about 2 weeks (around 10/17/2016) for trial of void - needs AM appt.  Nickie Retort, MD  [facility] [address] [place], [place] [address] [phone]

## 2016-10-03 NOTE — Progress Notes (Signed)
Simple Catheter Placement  Due to urinary retention patient is present today for a foley cath placement.  Patient was cleaned and prepped in a sterile fashion with betadine and lidocaine jelly 2% was instilled into the urethra.  A 16 FR foley coude catheter was inserted, urine return was noted  1000 ml, urine was dark yellow in color.  The balloon was filled with 10cc of sterile water.  A leg bag was attached for drainage. Patient was also given a night bag to take home and was given instruction on how to change from one bag to another.  Patient was given instruction on proper catheter care.  Patient tolerated well, no complications were noted   Preformed by: K.russell,CMA  Additional notes/ Follow up: 2 weeks

## 2016-10-16 ENCOUNTER — Ambulatory Visit (INDEPENDENT_AMBULATORY_CARE_PROVIDER_SITE_OTHER): Payer: Medicare Other

## 2016-10-16 VITALS — BP 177/89 | HR 80 | Ht 71.0 in | Wt 191.3 lb

## 2016-10-16 DIAGNOSIS — R339 Retention of urine, unspecified: Secondary | ICD-10-CM

## 2016-10-16 NOTE — Progress Notes (Signed)
Fill and Pull Catheter Removal  Patient is present today for a catheter removal.  Patient was cleaned and prepped in a sterile fashion 124ml of sterile water/ saline was instilled into the bladder when the patient felt the urge to urinate. 35ml of water was then drained from the balloon.  A 16FR foley cath was removed from the bladder no complications were noted .  Patient as then given some time to void on their own.  Patient can void  173ml on their own after some time.  Patient tolerated well.  Preformed by: Toniann Fail, LPN   Follow up/ Additional notes: reinforced with pt should not able to urinate or develops lower abd pain, n/v, f/c to RTC. Pt voiced understanding.   Blood pressure (!) 177/89, pulse 80, height 5\' 11"  (1.803 m), weight 191 lb 4.8 oz (86.8 kg).

## 2016-10-17 ENCOUNTER — Ambulatory Visit (INDEPENDENT_AMBULATORY_CARE_PROVIDER_SITE_OTHER): Payer: Medicare Other

## 2016-10-17 VITALS — BP 175/88 | HR 80 | Ht 71.0 in | Wt 193.2 lb

## 2016-10-17 DIAGNOSIS — R339 Retention of urine, unspecified: Secondary | ICD-10-CM | POA: Diagnosis not present

## 2016-10-17 LAB — BLADDER SCAN AMB NON-IMAGING: Scan Result: 836

## 2016-10-17 NOTE — Progress Notes (Signed)
Bladder Scan: 836 Patient can void: Performed By: Toniann Fail, LPN    Continuous Intermittent Catheterization  Due to urinary retention patient is present today for a teaching of self I & O Catheterization. Patient was given detailed verbal and printed instructions of self catheterization. Patient was cleaned and prepped in a sterile fashion.  With instruction and assistance patient inserted a 14FR and urine return was noted 700 ml, urine was yellow in color. Patient tolerated well, no complications were noted Patient was given a sample bag with supplies to take home.  Instructions were given per Dr. Erlene Quan for patient to cath 4 times daily.  An order was placed with [facility] for catheters to be sent to the patient's home. Patient is to follow up 1 month with Dr. Pilar Jarvis. Pt will cath indefinitely.   Preformed by: Toniann Fail, LPN   Blood pressure (!) 175/88, pulse 80, height 5\' 11"  (1.803 m), weight 193 lb 3.2 oz (87.6 kg).

## 2016-10-20 ENCOUNTER — Ambulatory Visit: Payer: Self-pay

## 2016-11-20 ENCOUNTER — Encounter: Payer: Self-pay | Admitting: Urology

## 2016-11-20 ENCOUNTER — Ambulatory Visit (INDEPENDENT_AMBULATORY_CARE_PROVIDER_SITE_OTHER): Payer: Medicare Other | Admitting: Urology

## 2016-11-20 VITALS — BP 158/92 | HR 71 | Ht 71.0 in | Wt 191.3 lb

## 2016-11-20 DIAGNOSIS — N400 Enlarged prostate without lower urinary tract symptoms: Secondary | ICD-10-CM | POA: Diagnosis not present

## 2016-11-20 DIAGNOSIS — R339 Retention of urine, unspecified: Secondary | ICD-10-CM

## 2016-11-20 LAB — URINALYSIS, COMPLETE
Bilirubin, UA: NEGATIVE
GLUCOSE, UA: NEGATIVE
Ketones, UA: NEGATIVE
Nitrite, UA: POSITIVE — AB
PH UA: 6 (ref 5.0–7.5)
Protein, UA: NEGATIVE
RBC, UA: NEGATIVE
Specific Gravity, UA: <1.005 — ABNORMAL LOW (ref 1.005–1.030)
Urobilinogen, Ur: 0.2 mg/dL (ref 0.2–1.0)

## 2016-11-20 LAB — MICROSCOPIC EXAMINATION
EPITHELIAL CELLS (NON RENAL): NONE SEEN /HPF (ref 0–10)
RBC MICROSCOPIC, UA: NONE SEEN /HPF (ref 0–?)

## 2016-11-20 LAB — BLADDER SCAN AMB NON-IMAGING: SCAN RESULT: 163

## 2016-11-20 MED ORDER — TAMSULOSIN HCL 0.4 MG PO CAPS: 0.8000 mg | ORAL_CAPSULE | Freq: Every day | ORAL | 3 refills | Status: DC

## 2016-11-20 MED ORDER — FINASTERIDE 5 MG PO TABS: 5.0000 mg | ORAL_TABLET | Freq: Every day | ORAL | 3 refills | Status: DC

## 2016-11-20 NOTE — Progress Notes (Signed)
11/20/2016 8:58 AM   Dalton Franklin [birth year] [id number]  Referring provider: Idelle Crouch, MD [place] [facility] [place], [place] [address]  Chief Complaint  Patient presents with  . Follow-up    BPH    HPI: The patient is a 76 year old man who has a history of urinary retention who presents with a chief complaint of not be able to control his urination. He was seen in the emergency department approximately one month ago when a Foley catheter was placed for 700 cc. This was removed his primary care provider's office a few days later.  He presents today with a PVR of 870 cc.  His only complaint is occasional incontinence. He wears a depends for this. It happens mostly at night. He feels that he empties his bladder and is currently in no discomfort. He states that his stream is strong. He denies hesitancy or urgency.  DRE was 2+, benign.   At his last visit 6 weeks ago, a foley was placed for one liter. His flomax was increased to 0.8 mg, and he was started on finasteride. He failed another trial of void 3 weeks ago and was taught CIC.  He was requiring CIC for volumes of 400-800 for approximately 2 weeks. One week ago he stopped catheterizing his he is able to void spontaneously. He would intermittently check a PVR was CIC which were between 50 and 100 cc. His PVR today is 163 cc. He has nocturia 1-2. He has a good stream. He feels like he empties his bladder. His incontinence has resolved. He no longer wears depends. He has a good stream. He has no more dripping.   PMH: Past Medical History:  Diagnosis Date  . Ganglion cyst of flexor tendon sheath of finger of left hand   . Hyperlipidemia     Surgical History: Past Surgical History:  Procedure Laterality Date  . COLONOSCOPY WITH PROPOFOL N/A 09/14/2015   Procedure: COLONOSCOPY WITH PROPOFOL;  Surgeon: Josefine Class, MD;  Location: [facility] ENDOSCOPY;  Service: Endoscopy;  Laterality: N/A;  .  TONSILLECTOMY      Home Medications:  Allergies as of 11/20/2016      Reactions   Meloxicam    Other    Other reaction(s): Unknown Antihistamines:  Urinary retention Antihistamines:  Urinary retention   Soma [carisoprodol]       Medication List       Accurate as of 11/20/16  8:58 AM. Always use your most recent med list.          atorvastatin 20 MG tablet Commonly known as:  LIPITOR Take 20 mg by mouth daily.   finasteride 5 MG tablet Commonly known as:  PROSCAR Take 1 tablet (5 mg total) by mouth daily.   ibuprofen 200 MG tablet Commonly known as:  ADVIL,MOTRIN Take 200 mg by mouth every 6 (six) hours as needed.   niacin 1000 MG CR tablet Commonly known as:  NIASPAN Take 1,000 mg by mouth at bedtime.   tamsulosin 0.4 MG Caps capsule Commonly known as:  FLOMAX Take 2 capsules (0.8 mg total) by mouth daily.       Allergies:  Allergies  Allergen Reactions  . Meloxicam   . Other     Other reaction(s): Unknown Antihistamines:  Urinary retention Antihistamines:  Urinary retention   . Soma [Carisoprodol]     Family History: Family History  Problem Relation Age of Onset  . Prostate cancer Neg Hx   . Kidney cancer  Neg Hx   . Bladder Cancer Neg Hx     Social History:  reports that he has quit smoking. He has never used smokeless tobacco. He reports that he does not drink alcohol or use drugs.  ROS: UROLOGY Frequent Urination?: No Hard to postpone urination?: No Burning/pain with urination?: No Get up at night to urinate?: No Leakage of urine?: No Urine stream starts and stops?: No Trouble starting stream?: No Do you have to strain to urinate?: No Blood in urine?: No Urinary tract infection?: No Sexually transmitted disease?: No Injury to kidneys or bladder?: No Painful intercourse?: No Weak stream?: No Erection problems?: No Penile pain?: No  Gastrointestinal Nausea?: No Vomiting?: No Indigestion/heartburn?: No Diarrhea?: No Constipation?:  No  Constitutional Fever: No Night sweats?: No Weight loss?: No Fatigue?: No  Skin Skin rash/lesions?: No Itching?: No  Eyes Blurred vision?: No Double vision?: No  Ears/Nose/Throat Sore throat?: No Sinus problems?: No  Hematologic/Lymphatic Swollen glands?: No Easy bruising?: No  Cardiovascular Leg swelling?: No Chest pain?: No  Respiratory Cough?: No Shortness of breath?: No  Endocrine Excessive thirst?: No  Musculoskeletal Back pain?: No Joint pain?: No  Neurological Headaches?: No Dizziness?: No  Psychologic Depression?: No Anxiety?: No  Physical Exam: BP (!) 158/92   Pulse 71   Ht 5\' 11"  (1.803 m)   Wt 191 lb 4.8 oz (86.8 kg)   BMI 26.68 kg/m   Constitutional:  Alert and oriented, No acute distress. HEENT: [place] AT, moist mucus membranes.  Trachea midline, no masses. Cardiovascular: No clubbing, cyanosis, or edema. Respiratory: Normal respiratory effort, no increased work of breathing. GI: Abdomen is soft, nontender, nondistended, no abdominal masses GU: No CVA tenderness.  Skin: No rashes, bruises or suspicious lesions. Lymph: No cervical or inguinal adenopathy. Neurologic: Grossly intact, no focal deficits, moving all 4 extremities. Psychiatric: Normal mood and affect.  Laboratory Data: Lab Results  Component Value Date   WBC 10.4 08/31/2016   HGB 15.0 08/31/2016   HCT 43.3 08/31/2016   MCV 85.1 08/31/2016   PLT 224 08/31/2016    Lab Results  Component Value Date   CREATININE 1.17 08/31/2016    No results found for: PSA  No results found for: TESTOSTERONE  No results found for: HGBA1C  Urinalysis    Component Value Date/Time   COLORURINE YELLOW (A) 08/31/2016 0915   APPEARANCEUR Clear 10/03/2016 0906   LABSPEC 1.012 08/31/2016 0915   LABSPEC 1.021 08/30/2014 1637   PHURINE 5.0 08/31/2016 0915   GLUCOSEU Negative 10/03/2016 0906   GLUCOSEU Negative 08/30/2014 1637   HGBUR NEGATIVE 08/31/2016 0915   BILIRUBINUR  Negative 10/03/2016 0906   BILIRUBINUR Negative 08/30/2014 1637   KETONESUR TRACE (A) 08/31/2016 0915   PROTEINUR Negative 10/03/2016 0906   PROTEINUR NEGATIVE 08/31/2016 0915   NITRITE Negative 10/03/2016 0906   NITRITE NEGATIVE 08/31/2016 0915   LEUKOCYTESUR Negative 10/03/2016 0906   LEUKOCYTESUR Negative 08/30/2014 1637      Assessment & Plan:    1. BPH Urinary retention has resolved with bimodal therapy with Flomax 0.8 mg daily and finasteride. We will continue this medication. Follow up in 3 months for PVR to ensure that he is still doing well. If he is doing well that time we will see him annually.   Return in about 3 months (around 02/17/2017).  Nickie Retort, MD  [facility] [address] [place], [place] [address] [phone]

## 2017-02-12 ENCOUNTER — Encounter: Payer: Self-pay | Admitting: Urology

## 2017-02-12 ENCOUNTER — Ambulatory Visit (INDEPENDENT_AMBULATORY_CARE_PROVIDER_SITE_OTHER): Payer: Medicare Other | Admitting: Urology

## 2017-02-12 VITALS — BP 147/84 | HR 64 | Ht 71.0 in | Wt 193.9 lb

## 2017-02-12 DIAGNOSIS — N400 Enlarged prostate without lower urinary tract symptoms: Secondary | ICD-10-CM

## 2017-02-12 LAB — BLADDER SCAN AMB NON-IMAGING: Scan Result: 167

## 2017-02-12 NOTE — Progress Notes (Signed)
02/12/2017 9:56 AM   Jenel Lucks 1941-04-28 [id number]  Referring provider: Idelle Crouch, MD [place] [facility] [place], [place] [address]  Chief Complaint  Patient presents with  . Follow-up    BPH    HPI: The patient is a 76 year old gentleman with a past medical history of urinary retention and BPH on Flomax 0.8 mg daily and finasteride presents today for follow-up. He has a history of urinary retention requiring Foley catheter eventually CIC. When this occurred, he was started on Flomax 0.8 mg daily and finasteride, his retention eventually resolved. He returns today for close follow-up to ensure that he is not developing recurrent retention. Currently he has no complaints. His I PSS score is 2/0. He has nocturia 2. He feels he empties his bladder. He denies frequency, intermittency, urgency, weak stream, and straining. He is delighted with his urinary quality of life. His PVR today is 167 cc. DRE a previous visit was 2+ benign.     PMH: Past Medical History:  Diagnosis Date  . Ganglion cyst of flexor tendon sheath of finger of left hand   . Hyperlipidemia     Surgical History: Past Surgical History:  Procedure Laterality Date  . COLONOSCOPY WITH PROPOFOL N/A 09/14/2015   Procedure: COLONOSCOPY WITH PROPOFOL;  Surgeon: Josefine Class, MD;  Location: [facility] ENDOSCOPY;  Service: Endoscopy;  Laterality: N/A;  . TONSILLECTOMY      Home Medications:  Allergies as of 02/12/2017      Reactions   Meloxicam    Other    Other reaction(s): Unknown Antihistamines:  Urinary retention Antihistamines:  Urinary retention   Soma [carisoprodol]       Medication List       Accurate as of 02/12/17  9:56 AM. Always use your most recent med list.          atorvastatin 20 MG tablet Commonly known as:  LIPITOR Take 20 mg by mouth daily.   finasteride 5 MG tablet Commonly known as:  PROSCAR Take 1 tablet (5 mg total) by mouth daily.   ibuprofen 200  MG tablet Commonly known as:  ADVIL,MOTRIN Take 200 mg by mouth every 6 (six) hours as needed.   niacin 1000 MG CR tablet Commonly known as:  NIASPAN Take 1,000 mg by mouth at bedtime.   tamsulosin 0.4 MG Caps capsule Commonly known as:  FLOMAX Take 2 capsules (0.8 mg total) by mouth daily.       Allergies:  Allergies  Allergen Reactions  . Meloxicam   . Other     Other reaction(s): Unknown Antihistamines:  Urinary retention Antihistamines:  Urinary retention   . Soma [Carisoprodol]     Family History: Family History  Problem Relation Age of Onset  . Prostate cancer Neg Hx   . Kidney cancer Neg Hx   . Bladder Cancer Neg Hx     Social History:  reports that he has quit smoking. He has never used smokeless tobacco. He reports that he does not drink alcohol or use drugs.  ROS: UROLOGY Frequent Urination?: No Hard to postpone urination?: No Burning/pain with urination?: No Get up at night to urinate?: No Leakage of urine?: No Urine stream starts and stops?: No Trouble starting stream?: No Do you have to strain to urinate?: No Blood in urine?: No Urinary tract infection?: No Sexually transmitted disease?: No Injury to kidneys or bladder?: No Painful intercourse?: No Weak stream?: No Erection problems?: No Penile pain?: No  Gastrointestinal Nausea?:  No Vomiting?: No Indigestion/heartburn?: No Diarrhea?: No Constipation?: No  Constitutional Fever: No Night sweats?: No Weight loss?: No Fatigue?: No  Skin Skin rash/lesions?: No Itching?: No  Eyes Blurred vision?: No Double vision?: No  Ears/Nose/Throat Sore throat?: No Sinus problems?: No  Hematologic/Lymphatic Swollen glands?: No Easy bruising?: No  Cardiovascular Leg swelling?: No Chest pain?: No  Respiratory Cough?: No Shortness of breath?: No  Endocrine Excessive thirst?: No  Musculoskeletal Back pain?: No Joint pain?: No  Neurological Headaches?: No Dizziness?:  No  Psychologic Depression?: No Anxiety?: No  Physical Exam: BP (!) 147/84   Pulse 64   Ht 5\' 11"  (1.803 m)   Wt 193 lb 14.4 oz (88 kg)   BMI 27.04 kg/m   Constitutional:  Alert and oriented, No acute distress. HEENT: [place] AT, moist mucus membranes.  Trachea midline, no masses. Cardiovascular: No clubbing, cyanosis, or edema. Respiratory: Normal respiratory effort, no increased work of breathing. GI: Abdomen is soft, nontender, nondistended, no abdominal masses GU: No CVA tenderness.  Skin: No rashes, bruises or suspicious lesions. Lymph: No cervical or inguinal adenopathy. Neurologic: Grossly intact, no focal deficits, moving all 4 extremities. Psychiatric: Normal mood and affect.  Laboratory Data: Lab Results  Component Value Date   WBC 10.4 08/31/2016   HGB 15.0 08/31/2016   HCT 43.3 08/31/2016   MCV 85.1 08/31/2016   PLT 224 08/31/2016    Lab Results  Component Value Date   CREATININE 1.17 08/31/2016    No results found for: PSA  No results found for: TESTOSTERONE  No results found for: HGBA1C  Urinalysis    Component Value Date/Time   COLORURINE YELLOW (A) 08/31/2016 0915   APPEARANCEUR Hazy (A) 11/20/2016 0841   LABSPEC 1.012 08/31/2016 0915   LABSPEC 1.021 08/30/2014 1637   PHURINE 5.0 08/31/2016 0915   GLUCOSEU Negative 11/20/2016 0841   GLUCOSEU Negative 08/30/2014 1637   HGBUR NEGATIVE 08/31/2016 0915   BILIRUBINUR Negative 11/20/2016 0841   BILIRUBINUR Negative 08/30/2014 1637   KETONESUR TRACE (A) 08/31/2016 0915   PROTEINUR Negative 11/20/2016 0841   PROTEINUR NEGATIVE 08/31/2016 0915   NITRITE Positive (A) 11/20/2016 0841   NITRITE NEGATIVE 08/31/2016 0915   LEUKOCYTESUR 1+ (A) 11/20/2016 0841   LEUKOCYTESUR Negative 08/30/2014 1637    Assessment & Plan:    1. BPH -continue flomax 0.8 mg daily and finasteride -follow up annually  2. History of urinary retention -resolved with bimodal therapy   Return in about 1 year (around  02/12/2018).  Nickie Retort, MD  [facility] [address], [place], [place] [address] [phone]

## 2018-01-05 ENCOUNTER — Telehealth: Payer: Self-pay | Admitting: Urology

## 2018-01-05 DIAGNOSIS — N400 Enlarged prostate without lower urinary tract symptoms: Secondary | ICD-10-CM

## 2018-01-05 DIAGNOSIS — R339 Retention of urine, unspecified: Secondary | ICD-10-CM

## 2018-01-05 NOTE — Telephone Encounter (Signed)
Pt Dalton Franklin asking for refills on his Finesteride and his Tamsulosin @ [facility].  Please advise pt @ [phone]. Thanks.

## 2018-01-07 MED ORDER — TAMSULOSIN HCL 0.4 MG PO CAPS
0.8000 mg | ORAL_CAPSULE | Freq: Every day | ORAL | 3 refills | Status: DC
Start: 2018-01-07 — End: 2018-01-19

## 2018-01-07 MED ORDER — FINASTERIDE 5 MG PO TABS: 5.0000 mg | ORAL_TABLET | Freq: Every day | ORAL | 3 refills | Status: DC

## 2018-01-07 NOTE — Telephone Encounter (Signed)
Medications refilled, left pt vmail

## 2018-01-19 ENCOUNTER — Other Ambulatory Visit: Payer: Self-pay

## 2018-01-19 DIAGNOSIS — R339 Retention of urine, unspecified: Secondary | ICD-10-CM

## 2018-01-19 DIAGNOSIS — N400 Enlarged prostate without lower urinary tract symptoms: Secondary | ICD-10-CM

## 2018-01-19 MED ORDER — FINASTERIDE 5 MG PO TABS: 5.0000 mg | ORAL_TABLET | Freq: Every day | ORAL | 3 refills | Status: DC

## 2018-01-19 MED ORDER — TAMSULOSIN HCL 0.4 MG PO CAPS: 0.8000 mg | ORAL_CAPSULE | Freq: Every day | ORAL | 3 refills | Status: DC

## 2018-02-11 ENCOUNTER — Encounter: Payer: Self-pay | Admitting: Urology

## 2018-02-11 ENCOUNTER — Ambulatory Visit (INDEPENDENT_AMBULATORY_CARE_PROVIDER_SITE_OTHER): Payer: Medicare Other | Admitting: Urology

## 2018-02-11 VITALS — BP 158/91 | HR 76 | Ht 71.0 in | Wt 189.9 lb

## 2018-02-11 DIAGNOSIS — N400 Enlarged prostate without lower urinary tract symptoms: Secondary | ICD-10-CM | POA: Diagnosis not present

## 2018-02-11 NOTE — Progress Notes (Signed)
02/11/2018 10:35 AM   Dalton Franklin 08/22/1941 [id number]  Referring provider: Idelle Crouch, MD [facility] [place], [place] [address]  Chief Complaint  Patient presents with  . Benign Prostatic Hypertrophy    HPI: The patient is a 77 year old gentleman with past medical history of urinary retention and BPH who is currently on Flomax 0.8 mg daily and finasteride presents today for annual follow up.  1.  BPH The patient was originally seen by [place] for urinary retention when he had a Foley catheter in December 2017.  He was started on Flomax 0.8 mg daily and finasteride.  He required CIC for a short period of time, but his retention eventually resolved.  Now it  has been well over a year since he required any form of catheterization.  He is very happy with his urinary quality of life on his current medication regimen.  He feels that he empties his bladder.  He has a good stream.  He denies hesitancy or intermittency.  He does note urgency with occasional urge incontinence.  He is not bothered by this.  He is quite happy that his retention has resolved.  Patient denies hematuria, nephrolithiasis, UTI over the last year.   PMH: Past Medical History:  Diagnosis Date  . Ganglion cyst of flexor tendon sheath of finger of left hand   . Hyperlipidemia     Surgical History: Past Surgical History:  Procedure Laterality Date  . COLONOSCOPY WITH PROPOFOL N/A 09/14/2015   Procedure: COLONOSCOPY WITH PROPOFOL;  Surgeon: Josefine Class, MD;  Location: [facility] ENDOSCOPY;  Service: Endoscopy;  Laterality: N/A;  . TONSILLECTOMY      Home Medications:  Allergies as of 02/11/2018      Reactions   Meloxicam    Other    Other reaction(s): Unknown Antihistamines:  Urinary retention Antihistamines:  Urinary retention   Soma [carisoprodol]       Medication List        Accurate as of 02/11/18 10:35 AM. Always use your most recent med list.            atorvastatin 20 MG tablet Commonly known as:  LIPITOR Take 20 mg by mouth daily.   finasteride 5 MG tablet Commonly known as:  PROSCAR Take 1 tablet (5 mg total) by mouth daily.   ibuprofen 200 MG tablet Commonly known as:  ADVIL,MOTRIN Take 200 mg by mouth every 6 (six) hours as needed.   MULTI-VITAMINS Tabs Take by mouth.   niacin 1000 MG CR tablet Commonly known as:  NIASPAN Take 1,000 mg by mouth at bedtime.   tamsulosin 0.4 MG Caps capsule Commonly known as:  FLOMAX Take 2 capsules (0.8 mg total) by mouth daily.   vitamin B-12 1000 MCG tablet Commonly known as:  CYANOCOBALAMIN Take by mouth.   VITAMIN D-1000 MAX ST 1000 units tablet Generic drug:  Cholecalciferol Take by mouth.       Allergies:  Allergies  Allergen Reactions  . Meloxicam   . Other     Other reaction(s): Unknown Antihistamines:  Urinary retention Antihistamines:  Urinary retention   . Soma [Carisoprodol]     Family History: Family History  Problem Relation Age of Onset  . Prostate cancer Neg Hx   . Kidney cancer Neg Hx   . Bladder Cancer Neg Hx     Social History:  reports that he has quit smoking. He has never used smokeless tobacco. He reports that he does not drink  alcohol or use drugs.  ROS: UROLOGY Frequent Urination?: No Hard to postpone urination?: No Burning/pain with urination?: No Get up at night to urinate?: No Leakage of urine?: No Urine stream starts and stops?: No Trouble starting stream?: No Do you have to strain to urinate?: No Blood in urine?: No Urinary tract infection?: No Sexually transmitted disease?: No Injury to kidneys or bladder?: No Painful intercourse?: No Weak stream?: No Erection problems?: No Penile pain?: No  Gastrointestinal Nausea?: No Vomiting?: No Indigestion/heartburn?: No Diarrhea?: No Constipation?: No  Constitutional Fever: No Night sweats?: No Weight loss?: No Fatigue?: No  Skin Skin rash/lesions?: No Itching?:  No  Eyes Blurred vision?: No Double vision?: No  Ears/Nose/Throat Sore throat?: No Sinus problems?: No  Hematologic/Lymphatic Swollen glands?: No Easy bruising?: No  Cardiovascular Leg swelling?: No Chest pain?: No  Respiratory Cough?: No Shortness of breath?: No  Endocrine Excessive thirst?: No  Musculoskeletal Back pain?: No Joint pain?: No  Neurological Headaches?: No Dizziness?: No  Psychologic Depression?: No Anxiety?: No  Physical Exam: BP (!) 158/91 (BP Location: Right Arm, Patient Position: Sitting, Cuff Size: Normal)   Pulse 76   Ht 5\' 11"  (1.803 m)   Wt 189 lb 14.4 oz (86.1 kg)   BMI 26.49 kg/m   Constitutional:  Alert and oriented, No acute distress. HEENT: [place] AT, moist mucus membranes.  Trachea midline, no masses. Cardiovascular: No clubbing, cyanosis, or edema. Respiratory: Normal respiratory effort, no increased work of breathing. GI: Abdomen is soft, nontender, nondistended, no abdominal masses GU: No CVA tenderness.  Skin: No rashes, bruises or suspicious lesions. Lymph: No cervical or inguinal adenopathy. Neurologic: Grossly intact, no focal deficits, moving all 4 extremities. Psychiatric: Normal mood and affect.  Laboratory Data: Lab Results  Component Value Date   WBC 10.4 08/31/2016   HGB 15.0 08/31/2016   HCT 43.3 08/31/2016   MCV 85.1 08/31/2016   PLT 224 08/31/2016    Lab Results  Component Value Date   CREATININE 1.17 08/31/2016    No results found for: PSA  No results found for: TESTOSTERONE  No results found for: HGBA1C  Urinalysis    Component Value Date/Time   COLORURINE YELLOW (A) 08/31/2016 0915   APPEARANCEUR Hazy (A) 11/20/2016 0841   LABSPEC 1.012 08/31/2016 0915   LABSPEC 1.021 08/30/2014 1637   PHURINE 5.0 08/31/2016 0915   GLUCOSEU Negative 11/20/2016 0841   GLUCOSEU Negative 08/30/2014 1637   HGBUR NEGATIVE 08/31/2016 0915   BILIRUBINUR Negative 11/20/2016 0841   BILIRUBINUR Negative  08/30/2014 1637   KETONESUR TRACE (A) 08/31/2016 0915   PROTEINUR Negative 11/20/2016 0841   PROTEINUR NEGATIVE 08/31/2016 0915   NITRITE Positive (A) 11/20/2016 0841   NITRITE NEGATIVE 08/31/2016 0915   LEUKOCYTESUR 1+ (A) 11/20/2016 0841   LEUKOCYTESUR Negative 08/30/2014 1637    Assessment & Plan:    1.  BPH -Continue Flomax 0.8 mg and finasteride.  Follow-up annually.  Return in about 1 year (around 02/12/2019).  Nickie Retort, MD  [facility] [address], [place], [place] [address] [phone]

## 2018-07-14 DIAGNOSIS — N401 Enlarged prostate with lower urinary tract symptoms: Secondary | ICD-10-CM | POA: Insufficient documentation

## 2018-08-27 ENCOUNTER — Other Ambulatory Visit: Payer: Self-pay | Admitting: Family Medicine

## 2018-08-27 DIAGNOSIS — N400 Enlarged prostate without lower urinary tract symptoms: Secondary | ICD-10-CM

## 2018-08-27 DIAGNOSIS — R339 Retention of urine, unspecified: Secondary | ICD-10-CM

## 2018-08-27 MED ORDER — TAMSULOSIN HCL 0.4 MG PO CAPS: 0.8000 mg | ORAL_CAPSULE | Freq: Every day | ORAL | 3 refills | Status: DC

## 2019-02-16 ENCOUNTER — Other Ambulatory Visit: Payer: Self-pay

## 2019-02-16 ENCOUNTER — Telehealth (INDEPENDENT_AMBULATORY_CARE_PROVIDER_SITE_OTHER): Payer: Medicare Other | Admitting: Urology

## 2019-02-16 DIAGNOSIS — R339 Retention of urine, unspecified: Secondary | ICD-10-CM | POA: Diagnosis not present

## 2019-02-16 NOTE — Progress Notes (Signed)
Virtual Visit via Telephone Note  I connected with Dalton Franklin on 02/16/19 at 10:00 AM EDT by telephone and verified that I am speaking with the correct person using two identifiers.   I discussed the limitations, risks, security and privacy concerns of performing an evaluation and management service by telephone and the availability of in person appointments. We discussed the impact of the COVID-19 on the healthcare system, and the importance of social distancing and reducing patient and provider exposure. I also discussed with the patient that there may be a patient responsible charge related to this service. The patient expressed understanding and agreed to proceed.  Reason for visit: 78 year old male for annual visit via telephone secondary to COVID-19 pandemic.  He did not have the capability of a video visit.  History of Present Illness: He has a history of urinary retention in December 2017.  He last saw Dr. Pilar Jarvis in May 2019.  He had been on tamsulosin 0.8 mg daily and finasteride.  He states he recently saw Dr. Doy Hutching and was asking if there was any medications he could discontinue.  It was recommended he stop the finasteride and monitor his symptoms.  He states he has not seen any significant change in his voiding pattern since stopping the finasteride.  He remains on tamsulosin.  Denies dysuria or gross hematuria.  Assessment and Plan: 78 year old male with BPH and a history of urinary retention.  He stopped the finasteride approximately 1 month ago.  He was informed that over the next 5 months his prostate will enlarged to the size it was prior to starting the finasteride and he could potentially develop worsening voiding symptoms over this time.  He states he has a follow-up appointment with Dr. Doy Hutching in 6 months.  He did not need a refill on tamsulosin.  Follow Up: Will see him back in 1 year and he was instructed to call earlier for any significant change in his voiding pattern.   I discussed the assessment and treatment plan with the patient. The patient was provided an opportunity to ask questions and all were answered. The patient agreed with the plan and demonstrated an understanding of the instructions.   The patient was advised to call back or seek an in-person evaluation if the symptoms worsen or if the condition fails to improve as anticipated.  I provided 7 minutes of non-face-to-face time during this encounter.   Abbie Sons, MD

## 2019-04-25 ENCOUNTER — Other Ambulatory Visit: Payer: Self-pay | Admitting: Family Medicine

## 2019-04-25 DIAGNOSIS — N400 Enlarged prostate without lower urinary tract symptoms: Secondary | ICD-10-CM

## 2019-04-25 DIAGNOSIS — R339 Retention of urine, unspecified: Secondary | ICD-10-CM

## 2019-04-25 MED ORDER — TAMSULOSIN HCL 0.4 MG PO CAPS: 0.8000 mg | ORAL_CAPSULE | Freq: Every day | ORAL | 3 refills | Status: DC

## 2019-11-29 ENCOUNTER — Other Ambulatory Visit: Payer: Self-pay | Admitting: Urology

## 2019-11-29 DIAGNOSIS — R339 Retention of urine, unspecified: Secondary | ICD-10-CM

## 2019-11-29 DIAGNOSIS — N400 Enlarged prostate without lower urinary tract symptoms: Secondary | ICD-10-CM

## 2020-02-07 ENCOUNTER — Other Ambulatory Visit: Payer: Self-pay | Admitting: Internal Medicine

## 2020-02-07 DIAGNOSIS — R42 Dizziness and giddiness: Secondary | ICD-10-CM

## 2020-02-07 DIAGNOSIS — R270 Ataxia, unspecified: Secondary | ICD-10-CM

## 2020-02-16 ENCOUNTER — Ambulatory Visit: Payer: Medicare Other | Admitting: Urology

## 2020-02-21 ENCOUNTER — Ambulatory Visit
Admission: RE | Admit: 2020-02-21 | Discharge: 2020-02-21 | Disposition: A | Discharge status: Home / Self Care | Payer: Medicare Other | Source: Ambulatory Visit | Attending: Internal Medicine | Admitting: Internal Medicine

## 2020-02-21 ENCOUNTER — Other Ambulatory Visit: Payer: Self-pay

## 2020-02-21 DIAGNOSIS — R270 Ataxia, unspecified: Secondary | ICD-10-CM | POA: Diagnosis present

## 2020-02-21 DIAGNOSIS — R42 Dizziness and giddiness: Secondary | ICD-10-CM | POA: Insufficient documentation

## 2020-04-11 ENCOUNTER — Telehealth: Payer: Self-pay | Admitting: Adult Health

## 2020-04-11 NOTE — Telephone Encounter (Signed)
I connected by phone with Dalton Franklin and/or patient's caregiver on 04/11/2020 at 6:53 PM to discuss the potential vaccination through our Homebound vaccination initiative.   Prevaccination Checklist for COVID-19 Vaccines  1.  Are you feeling sick today? no  2.  Have you ever received a dose of a COVID-19 vaccine?  no      If yes, which one? None   3.  Have you ever had an allergic reaction: (This would include a severe reaction [ e.g., anaphylaxis] that required treatment with epinephrine or EpiPen or that caused you to go to the hospital.  It would also include an allergic reaction that occurred within 4 hours that caused hives, swelling, or respiratory distress, including wheezing.) A.  A previous dose of COVID-19 vaccine. no  B.  A vaccine or injectable therapy that contains multiple components, one of which is a COVID-19 vaccine component, but it is not known which component elicited the immediate reaction. no  C.  Are you allergic to polyethylene glycol? no  D. Are you allergic to Polysorbate, which is found in some vaccines, film coated tablets and intravenous steroids?  no   4.  Have you ever had an allergic reaction to another vaccine (other than COVID-19 vaccine) or an injectable medication? (This would include a severe reaction [ e.g., anaphylaxis] that required treatment with epinephrine or EpiPen or that caused you to go to the hospital.  It would also include an allergic reaction that occurred within 4 hours that caused hives, swelling, or respiratory distress, including wheezing.)  no   5.  Have you ever had a severe allergic reaction (e.g., anaphylaxis) to something other than a component of the COVID-19 vaccine, or any vaccine or injectable medication?  This would include food, pet, venom, environmental, or oral medication allergies.  no   6.  Have you received any vaccine in the last 14 days? no   7.  Have you ever had a positive test for COVID-19 or has a doctor ever told  you that you had COVID-19?  no   8.  Have you received passive antibody therapy (monoclonal antibodies or convalescent serum) as a treatment for COVID-19? no   9.  Do you have a weakened immune system caused by something such as HIV infection or cancer or do you take immunosuppressive drugs or therapies?  no   10.  Do you have a bleeding disorder or are you taking a blood thinner? no   11.  Are you pregnant or breast-feeding? no   12.  Do you have dermal fillers? no   __________________   This patient is a 79 y.o. male that meets the FDA criteria to receive homebound vaccination. Patient or parent/caregiver understands they have the option to accept or refuse homebound vaccination.  Patient passed the pre-screening checklist and would like to proceed with homebound vaccination.  Based on questionnaire above, I recommend the patient be observed for 15 minutes.  There are an estimated #1 other household members/caregivers who are also interested in receiving the vaccine. Dalton Franklin (05/29/1959)      I will send the patient's information to our scheduling team who will reach out to schedule the patient and potential caregiver/family members for homebound vaccination.    Scot Dock 04/11/2020 6:53 PM

## 2020-04-19 ENCOUNTER — Other Ambulatory Visit: Payer: Self-pay | Admitting: Acute Care

## 2020-04-19 DIAGNOSIS — R270 Ataxia, unspecified: Secondary | ICD-10-CM

## 2020-04-19 DIAGNOSIS — R42 Dizziness and giddiness: Secondary | ICD-10-CM

## 2020-05-04 ENCOUNTER — Ambulatory Visit
Admission: RE | Admit: 2020-05-04 | Discharge: 2020-05-04 | Disposition: A | Discharge status: Home / Self Care | Payer: Medicare Other | Source: Ambulatory Visit | Attending: Acute Care | Admitting: Acute Care

## 2020-05-04 ENCOUNTER — Other Ambulatory Visit: Payer: Self-pay

## 2020-05-04 DIAGNOSIS — R42 Dizziness and giddiness: Secondary | ICD-10-CM | POA: Diagnosis not present

## 2020-05-04 DIAGNOSIS — R270 Ataxia, unspecified: Secondary | ICD-10-CM

## 2020-05-07 ENCOUNTER — Other Ambulatory Visit: Payer: Self-pay | Admitting: Urology

## 2020-05-07 DIAGNOSIS — R339 Retention of urine, unspecified: Secondary | ICD-10-CM

## 2020-05-07 DIAGNOSIS — N400 Enlarged prostate without lower urinary tract symptoms: Secondary | ICD-10-CM

## 2020-07-25 ENCOUNTER — Other Ambulatory Visit: Payer: Self-pay | Admitting: Internal Medicine

## 2020-07-25 DIAGNOSIS — R1310 Dysphagia, unspecified: Secondary | ICD-10-CM

## 2020-08-01 ENCOUNTER — Other Ambulatory Visit: Payer: Self-pay | Admitting: Internal Medicine

## 2020-08-01 ENCOUNTER — Ambulatory Visit
Admission: RE | Admit: 2020-08-01 | Discharge: 2020-08-01 | Disposition: A | Discharge status: Home / Self Care | Payer: Medicare Other | Source: Ambulatory Visit | Attending: Internal Medicine | Admitting: Internal Medicine

## 2020-08-01 ENCOUNTER — Other Ambulatory Visit: Payer: Self-pay

## 2020-08-01 DIAGNOSIS — R1310 Dysphagia, unspecified: Secondary | ICD-10-CM

## 2020-08-02 ENCOUNTER — Ambulatory Visit: Payer: Medicare Other

## 2021-04-30 ENCOUNTER — Other Ambulatory Visit: Payer: Self-pay | Admitting: Urology

## 2021-04-30 DIAGNOSIS — R339 Retention of urine, unspecified: Secondary | ICD-10-CM

## 2021-04-30 DIAGNOSIS — N400 Enlarged prostate without lower urinary tract symptoms: Secondary | ICD-10-CM

## 2021-07-12 ENCOUNTER — Other Ambulatory Visit: Payer: Self-pay

## 2021-07-12 ENCOUNTER — Encounter: Payer: Self-pay | Admitting: Urology

## 2021-07-12 ENCOUNTER — Ambulatory Visit (INDEPENDENT_AMBULATORY_CARE_PROVIDER_SITE_OTHER): Payer: Medicare Other | Admitting: Urology

## 2021-07-12 DIAGNOSIS — N401 Enlarged prostate with lower urinary tract symptoms: Secondary | ICD-10-CM

## 2021-07-12 MED ORDER — FINASTERIDE 5 MG PO TABS: 5.0000 mg | ORAL_TABLET | Freq: Every day | ORAL | 3 refills | Status: AC

## 2021-07-12 MED ORDER — TAMSULOSIN HCL 0.4 MG PO CAPS: 0.8000 mg | ORAL_CAPSULE | Freq: Every day | ORAL | 5 refills | Status: AC

## 2021-07-12 MED ORDER — TAMSULOSIN HCL 0.4 MG PO CAPS: 0.8000 mg | ORAL_CAPSULE | Freq: Every day | ORAL | 5 refills | Status: DC

## 2021-07-12 MED ORDER — FINASTERIDE 5 MG PO TABS
5.0000 mg | ORAL_TABLET | Freq: Every day | ORAL | 3 refills | Status: DC
Start: 2021-07-12 — End: 2021-07-12

## 2021-07-12 NOTE — Progress Notes (Signed)
07/12/2021 9:02 AM   Jenel Lucks 1941-08-14 [id number]  Referring provider: Idelle Crouch, MD [facility] [place],  [place] [address]  Chief Complaint  Patient presents with   Medication Refill    Urologic history: 1.  BPH with LUTS Tamsulosin 0.8 mg/finasteride   HPI: 80 y.o. male presents for follow-up of BPH.  Last visit was a telehealth visit 02/16/2019 He states he was placed on finasteride and tamsulosin after an episode of urinary retention which was precipitated by decongestant medication He inquired about stopping his BPH medications as he does not like to take medications unless absolutely necessary Presently has no bothersome lower urinary tract symptoms except for occasional urgency with rare episodes of urge incontinence   PMH: Past Medical History:  Diagnosis Date   BPH (benign prostatic hyperplasia)    Ganglion cyst of flexor tendon sheath of finger of left hand    Hyperlipidemia     Surgical History: Past Surgical History:  Procedure Laterality Date   COLONOSCOPY WITH PROPOFOL N/A 09/14/2015   Procedure: COLONOSCOPY WITH PROPOFOL;  Surgeon: Josefine Class, MD;  Location: [facility] ENDOSCOPY;  Service: Endoscopy;  Laterality: N/A;   TONSILLECTOMY      Home Medications:  Allergies as of 07/12/2021       Reactions   Meloxicam    Other    Other reaction(s): Unknown Antihistamines:  Urinary retention Antihistamines:  Urinary retention Other reaction(s): Unknown Antihistamines:  Urinary retention Other reaction(s): Unknown Antihistamines:  Urinary retention Antihistamines:  Urinary retention   Soma [carisoprodol]         Medication List        Accurate as of July 12, 2021  9:02 AM. If you have any questions, ask your nurse or doctor.          atorvastatin 20 MG tablet Commonly known as: LIPITOR Take 20 mg by mouth daily.   Cholecalciferol 25 MCG (1000 UT) tablet Take by mouth.   finasteride  5 MG tablet Commonly known as: PROSCAR Take 1 tablet (5 mg total) by mouth daily.   ibuprofen 200 MG tablet Commonly known as: ADVIL Take 200 mg by mouth every 6 (six) hours as needed.   Multi-Vitamins Tabs Take by mouth.   niacin 1000 MG CR tablet Commonly known as: NIASPAN Take 1,000 mg by mouth at bedtime.   tamsulosin 0.4 MG Caps capsule Commonly known as: FLOMAX Take 2 capsules (0.8 mg total) by mouth daily.   vitamin B-12 1000 MCG tablet Commonly known as: CYANOCOBALAMIN Take by mouth.        Allergies:  Allergies  Allergen Reactions   Meloxicam    Other     Other reaction(s): Unknown Antihistamines:  Urinary retention Antihistamines:  Urinary retention  Other reaction(s): Unknown Antihistamines:  Urinary retention Other reaction(s): Unknown Antihistamines:  Urinary retention Antihistamines:  Urinary retention   Soma [Carisoprodol]     Family History: Family History  Problem Relation Age of Onset   Prostate cancer Neg Hx    Kidney cancer Neg Hx    Bladder Cancer Neg Hx     Social History:  reports that he has quit smoking. He has never used smokeless tobacco. He reports that he does not drink alcohol and does not use drugs.   Physical Exam: BP (!) 170/87 (BP Location: Left Arm, Patient Position: Sitting, Cuff Size: Normal)   Pulse 67   Ht 5\' 6"  (1.676 m)   Wt 180 lb (81.6 kg)  BMI 29.05 kg/m   Constitutional:  Alert and oriented, No acute distress. HEENT: [place] AT, moist mucus membranes.  Trachea midline, no masses. Cardiovascular: No clubbing, cyanosis, or edema. Respiratory: Normal respiratory effort, no increased work of breathing. Psychiatric: Normal mood and affect.   Assessment & Plan:    1.  BPH with LUTS We discussed he could stop his BPH medications.  His symptoms may or may not worsen.  If he had increasing symptoms shortly after discontinuing he was informed this would be related to the tamsulosin being discontinued and he could  start back We discussed possibility of recurrent urinary retention He did request a refill of tamsulosin and finasteride He is leaning toward discontinuing the medications and will restart if he has worsening symptoms Schedule follow-up 1 year or earlier for worsening voiding symptoms   Abbie Sons, MD  [facility] [address] [place], [place] [address] [phone]

## 2021-09-27 IMAGING — RF DG UGI W/ HIGH DENSITY W/O KUB
11 series · 12 of 12 positions shown · non-contrast
Comparison: No prior.

CLINICAL DATA: Dysphagia.  Coughing.

EXAM:
UPPER GI SERIES WITHOUT KUB
TECHNIQUE: Routine upper GI series was performed with high density and thin
barium.
FLUOROSCOPY TIME:  Fluoroscopy Time:  1 minutes 42 seconds.
Radiation Exposure Index (if provided by the fluoroscopic device):
44.5 mGy

[Series 1: fluoro_barium 2fps_bw · 0.19mm/px · 2 of 2 frames shown (1 of 11)]
[frame 1/2]
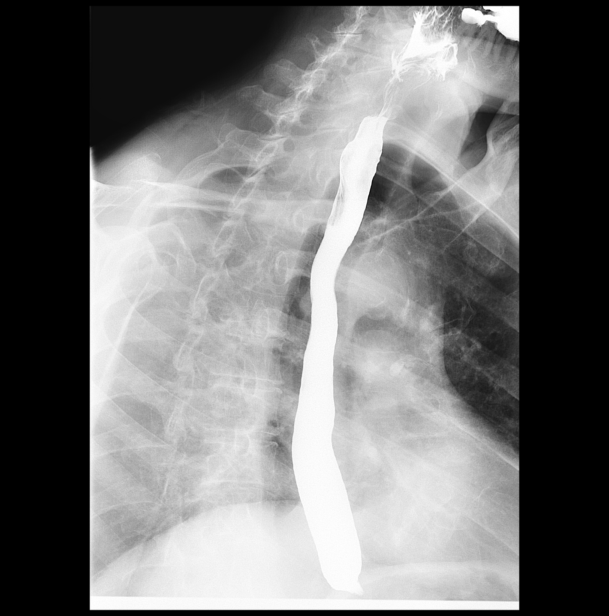
[frame 2/2]
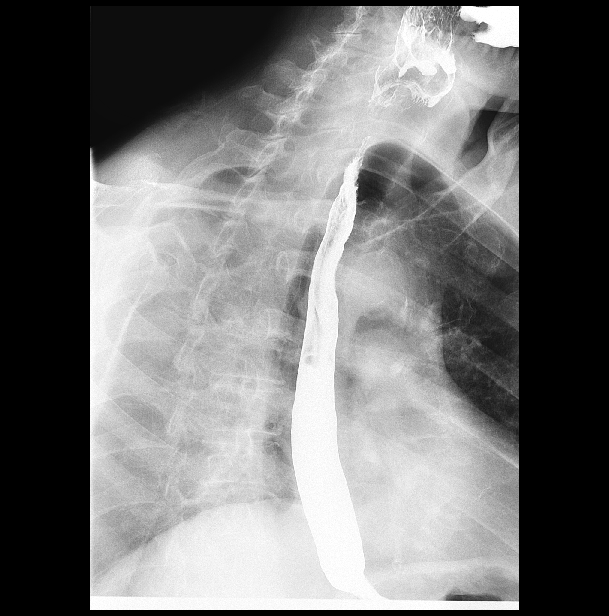

[Series 2: fluoro_barium 2fps_bw · 0.19mm/px · 1 of 1 slices shown (2 of 11)]
[im 1/1]
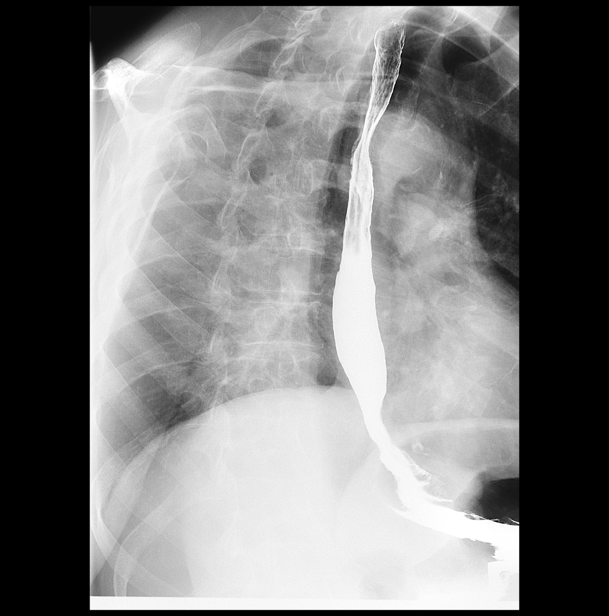

[Series 3: fluoro_barium 2fps_bw · 0.19mm/px · 1 of 1 slices shown (3 of 11)]
[im 1/1]
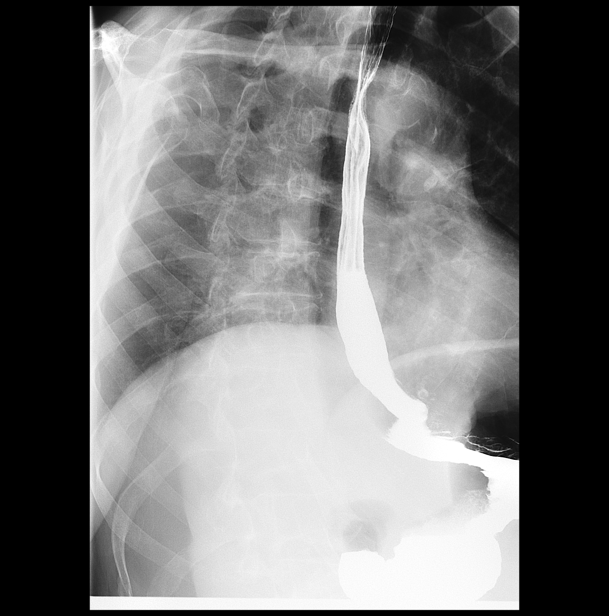

[Series 4: fluoro_barium 2fps_bw · 0.19mm/px · 1 of 1 slices shown (4 of 11)]
[im 1/1]
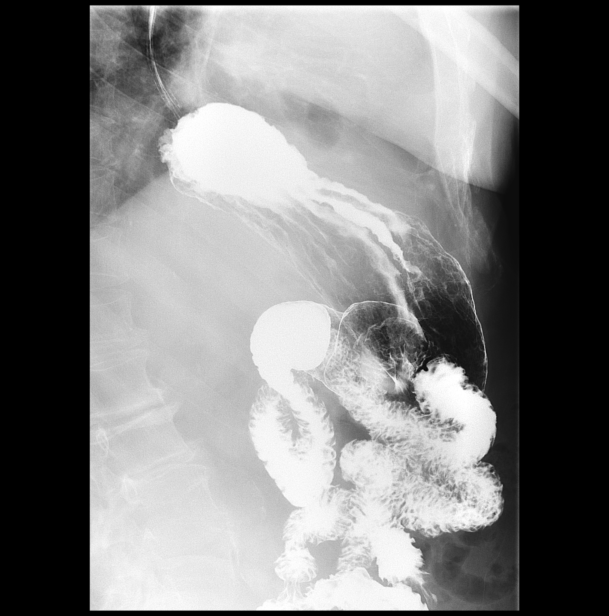

[Series 5: fluoro_barium 2fps_bw · 0.19mm/px · 1 of 1 slices shown (5 of 11)]
[im 1/1]
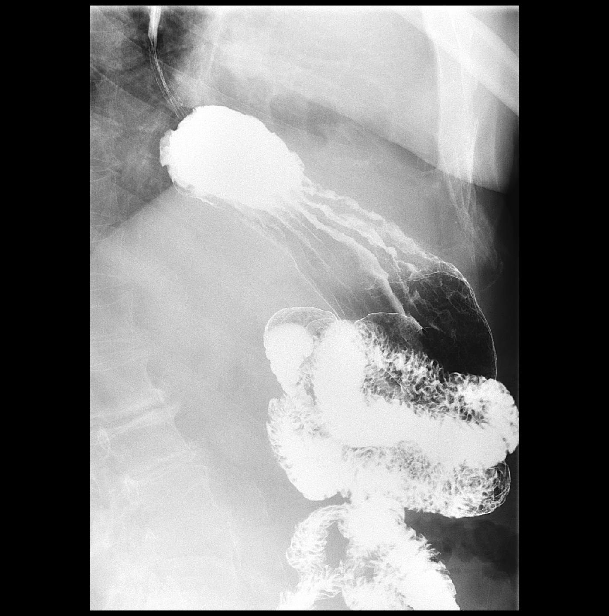

[Series 6: fluoro_barium 2fps_bw · 0.19mm/px · 1 of 1 slices shown (6 of 11)]
[im 1/1]
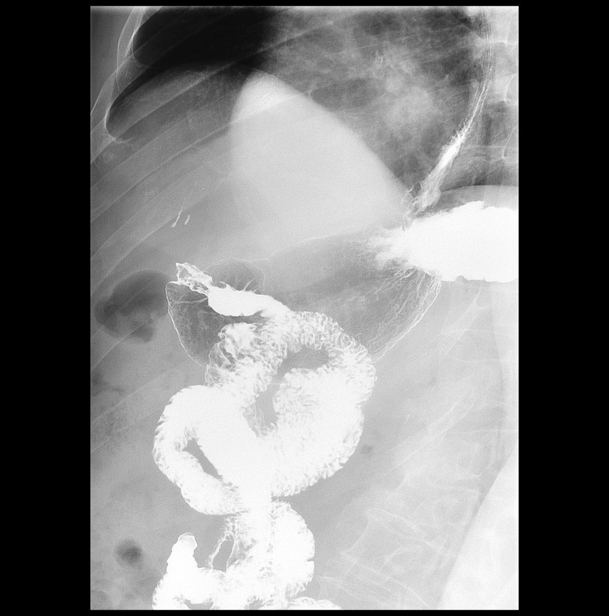

[Series 7: fluoro_barium 2fps_bw · 0.19mm/px · 1 of 1 slices shown (7 of 11)]
[im 1/1]
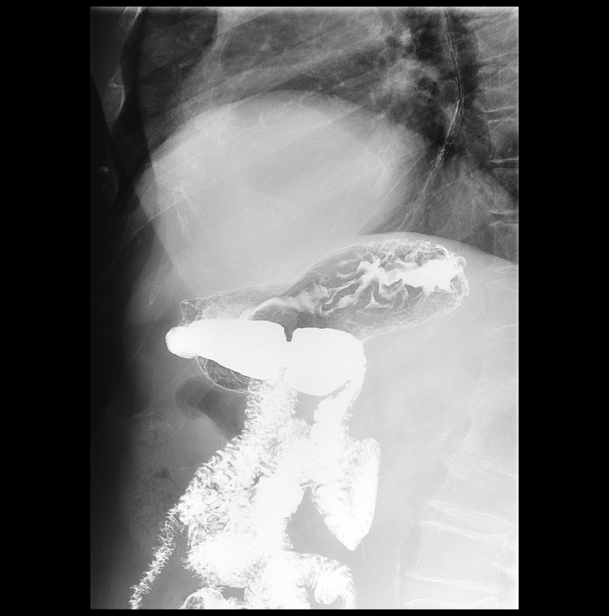

[Series 8: fluoro_barium 2fps_bw · 0.19mm/px · 1 of 1 slices shown (8 of 11)]
[im 1/1]
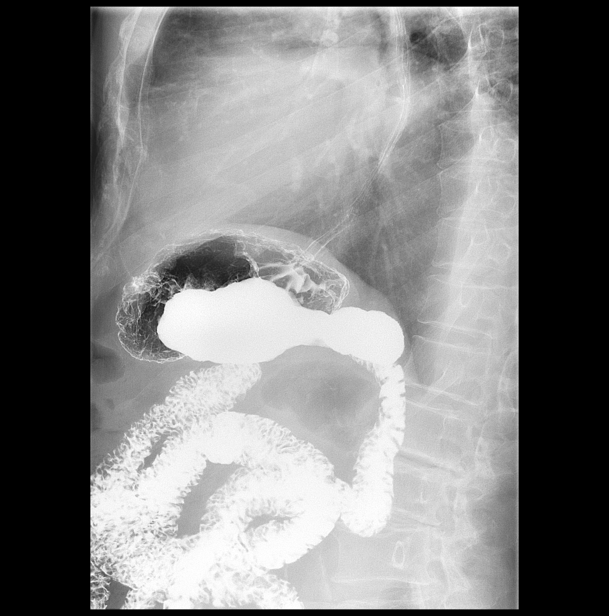

[Series 9: fluoro_barium 2fps_bw · 0.19mm/px · 1 of 1 slices shown (9 of 11)]
[im 1/1]
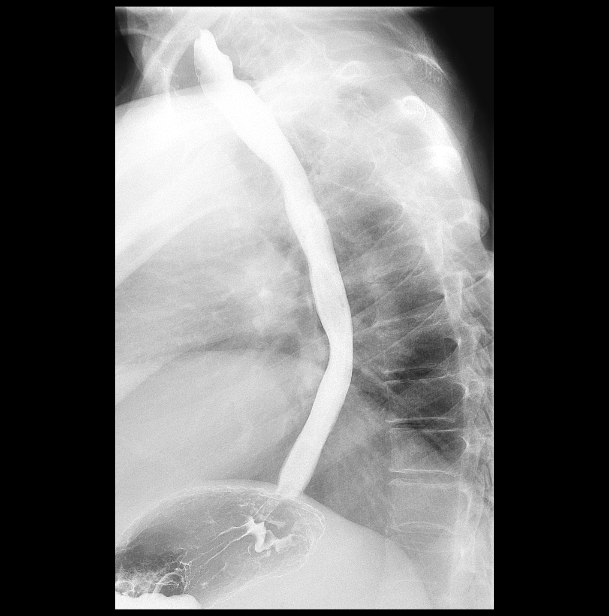

[Series 10: fluoro_barium 2fps_bw · 0.19mm/px · 1 of 1 slices shown (10 of 11)]
[im 1/1]
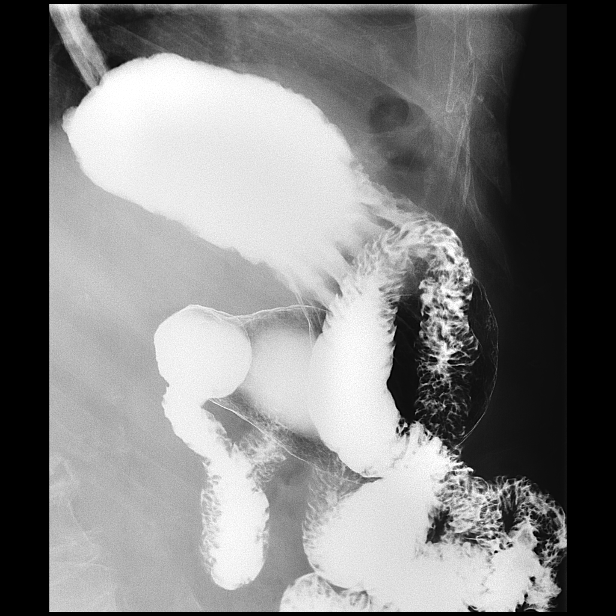

[Series 11: fluoro_barium 2fps_bw · 0.19mm/px · 1 of 1 slices shown (11 of 11)]
[im 1/1]
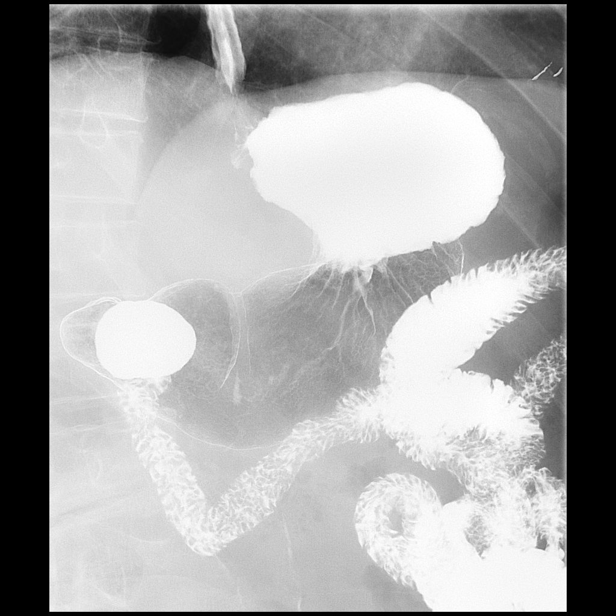

[12 of 12 positions shown; findings below may reference images not displayed]

FINDINGS: The esophagus is widely patent. No focal esophageal abnormality
identified. No evidence of hiatal hernia or reflux. The stomach
appears normal. No focal gastric abnormality identified. Duodenal
bulb and C-loop are normal. No evidence of ulceration.
IMPRESSION: Normal exam.

## 2021-12-19 ENCOUNTER — Other Ambulatory Visit: Payer: Self-pay | Admitting: Neurology

## 2021-12-19 ENCOUNTER — Other Ambulatory Visit (HOSPITAL_COMMUNITY): Payer: Self-pay | Admitting: Neurology

## 2021-12-19 DIAGNOSIS — G20 Parkinson's disease: Secondary | ICD-10-CM

## 2021-12-31 ENCOUNTER — Ambulatory Visit (HOSPITAL_COMMUNITY): Payer: Medicare Other

## 2021-12-31 ENCOUNTER — Ambulatory Visit
Admission: RE | Admit: 2021-12-31 | Discharge: 2021-12-31 | Disposition: A | Discharge status: Home / Self Care | Payer: Medicare Other | Source: Ambulatory Visit | Attending: Neurology | Admitting: Neurology

## 2021-12-31 ENCOUNTER — Other Ambulatory Visit: Payer: Self-pay

## 2021-12-31 DIAGNOSIS — G20 Parkinson's disease: Secondary | ICD-10-CM | POA: Diagnosis present

## 2022-07-14 ENCOUNTER — Ambulatory Visit: Payer: Medicare Other | Admitting: Urology

## 2022-07-14 ENCOUNTER — Encounter: Payer: Self-pay | Admitting: Urology

## 2022-07-15 ENCOUNTER — Telehealth: Payer: Self-pay | Admitting: Urology

## 2022-07-15 NOTE — Telephone Encounter (Signed)
Patient states he is not going to need [place] anymore. He is doing great . Noticed  patient that if he is not seen in one year we cannot refill medications. He understands

## 2022-07-15 NOTE — Telephone Encounter (Signed)
FYI - Pt called missed appt, also he said he is doing fine.  He said he is on medication Carbidopa from his [address].

## 2024-02-01 ENCOUNTER — Ambulatory Visit: Attending: Neurology | Admitting: Speech Pathology

## 2024-02-01 DIAGNOSIS — R1312 Dysphagia, oropharyngeal phase: Secondary | ICD-10-CM | POA: Diagnosis present

## 2024-02-01 NOTE — Therapy (Signed)
 OUTPATIENT SPEECH LANGUAGE PATHOLOGY PARKINSON'S EVALUATION DYSPHAGIA EVALUATION   Patient Name: Dalton Franklin MRN: [id number] DOB:02-24-1941, 83 y.o., male Today's Date: 02/01/2024  PCP: Shary Deems, MD REFERRING PROVIDER: Devora Folks, MD   End of Session - 02/01/24 1138     Visit Number 1    Number of Visits 1    SLP Start Time 1100    SLP Stop Time  1138    SLP Time Calculation (min) 38 min    Activity Tolerance Patient tolerated treatment well             Past Medical History:  Diagnosis Date   BPH (benign prostatic hyperplasia)    Ganglion cyst of flexor tendon sheath of finger of left hand    Hyperlipidemia    Past Surgical History:  Procedure Laterality Date   COLONOSCOPY WITH PROPOFOL  N/A 09/14/2015   Procedure: COLONOSCOPY WITH PROPOFOL ;  Surgeon: Luella Sager, MD;  Location: [facility] ENDOSCOPY;  Service: Endoscopy;  Laterality: N/A;   TONSILLECTOMY     Patient Active Problem List   Diagnosis Date Noted   Benign prostatic hyperplasia with urinary frequency 07/14/2018   Chronic left shoulder pain 10/03/2016   Ganglion cyst 10/03/2016   History of colonic polyps 10/03/2016   Hyperlipidemia, unspecified 10/03/2016   Vitamin D deficiency 10/03/2016   Complete tear of left rotator cuff 07/25/2016   Rotator cuff tendinitis, left 07/25/2016    ONSET DATE: prior to 2021; date of referral 01/25/2024  REFERRING DIAG: R13.10 (ICD-10-CM) - Swallowing difficulty  THERAPY DIAG:  Dysphagia, oropharyngeal phase  Rationale for Evaluation and Treatment Rehabilitation  SUBJECTIVE:   SUBJECTIVE STATEMENT: Pt pleasant, good historian, good speech intelligibility Pt accompanied by: self  PERTINENT HISTORY: Pt is a 83 year old male with medical history of Parkinson's Disease, sensory motor polyneuropathy, OSA, Carpal Tunnel Syndrome.   DIAGNOSTIC FINDINGS:   DG Esophagus - 08/01/2020 FINDINGS: The esophagus is widely patent. No focal esophageal  abnormality identified. No evidence of hiatal hernia or reflux. The stomach appears normal. No focal gastric abnormality identified. Duodenal bulb and C-loop are normal. No evidence of ulceration.  MRI 12/31/2021 No evidence of acute intracranial abnormality.   Minimal chronic small-vessel ischemic changes within the cerebral white matter, stable from the prior brain MRI 02/21/2020.   Mild generalized cerebral atrophy, not unexpected for age.    PAIN:  Are you having pain? No  FALLS: Has patient fallen in last 6 months?  No  LIVING ENVIRONMENT: Lives with: lives with their spouse Lives in: House/apartment  PLOF:  Level of assistance: Independent with ADLs, Independent with IADLs Employment: Retired  PATIENT GOALS    to have swallowing assessed  OBJECTIVE:  COGNITIVE COMMUNICATION Overall cognitive status: Within functional limits for tasks assessed Auditory comprehension: WFL Verbal expression: WFL Functional communication: WFL   MOTOR SPEECH: Overall motor speech: Appears intact Respiration: diaphragmatic/abdominal breathing Phonation: normal Resonance: WFL Articulation: Appears intact Intelligibility: Intelligible Motor planning: Appears intact  ORAL MOTOR EXAMINATION Facial : WFL Lingual: WFL Velum: WFL Mandible: WFL Cough: WFL Voice: WFL   OBJECTIVE VOICE ASSESSMENT:   CLINICAL SWALLOW ASSESSMENT:   Current diet: regular and thin liquids Dentition: adequate natural dentition Feeding: able to feed self Consistencies tested: Thin Liquid: Presentation: Cup and Self-fed Oral Phase: WFL Pharyngeal Phase: WFL Regular: Presentation: By hand and Self-fed Oral Phase: WFL Pharyngeal Phase: WFL   Evaluation findings: Patient presents with oropharyngeal swallow which appears clinically to be within functional limits with adequate airway protection. Oral stage is  characterized by appearance of adequate oral containment, mastication, bolus formation, oral  transfer and oral clearance. Swallow initiation appears timely. No overt signs of aspiration observed despite challenging with consecutive straw sips of thin liquids in excess of 3oz.  Aspiration risk factors:Neurological disease Overall aspiration risk:No limitations Diet Recommendations: regular and thin liquids Precautions:Minimize environmental distractions, Slow rate, Small sips/bites, and Seated upright 90 degrees Supervision: Patient able to feed self Oral care recommendations:Oral care BID Follow-up recommendations: No treatment recommended  PATIENT REPORTED OUTCOME MEASURES (PROM):  EATING ASSESSMENT TOOL (EAT-10)   The patient was asked to rate to what extent the following statements are problematic on a scale of 0-4. 0 = No problem; 4 = Severe problem. A total score of 3 or higher is considered abnormal.  1.) My swallowing problem has caused me to lose weight. 0 2.) My swallowing problem interferes with my ability to go out for meals. 0 3.) Swallowing liquids takes extra effort. 0 4.) Swallowing solids takes extra effort. 0  5.) Swallowing pills takes extra effort. 0 6.) Swallowing is painful. 0 7.) The pleasure of eating is affected by my swallowing. 0 8.) When I swallow food sticks in my throat. 0 9.) I cough when I eat. 1 10.) Swallowing is stressful. 0   TOTAL SCORE: 1    TODAY'S TREATMENT:  Skilled education provided on general aspiration precautions Strength of cough as protective mechanism   PATIENT EDUCATION: Education details: Results of this assessment,  Person educated: Patient Education method: Explanation Education comprehension: verbalized understanding   ASSESSMENT:  CLINICAL IMPRESSION: Patient is a 83 y.o. male who was seen today for a clinical swallow evaluation. Pt reports "every so often it feels like something is going down the wrong side doesn't happen all the time." When this happens he reports a productive cough. No recent unintentional  weight loss or respiratory decline.   During this evaluation, pt presents with adequate oropharyngeal abilities when consuming thin liquids and graham crackers. At this time, his risk of aspiration appears reduced. All education completed and all questions answered. In addition, his speech intelligibility and vocal quality remained functional throughout. No further services are indicated at this time.      PLAN: No further services are indicated at this time. Pt in agreement and will reach back out if further issues arise.    Raniya Golembeski B. Garlin Junker, M.S., CCC-SLP, Tree surgeon Certified Brain Injury Specialist [facility]  [facility] Rehabilitation Services Office [phone] Ascom [phone] Fax [phone]

## 2024-05-02 NOTE — Progress Notes (Signed)
 ENCOUNTER: Patient Class :No patient class for patient encounter Department: [facility] [address] [place] [address]  PATIENT: Patient Demographics      Name Patient ID SSN Gender Identity Birth Date   Dalton Franklin, Dalton Franklin [id number] Male 10-25-2040 (82 yrs)          Address Phone Email       [address] [place] [address] [phone] [email or web address]) [email or web address]            [place] Race         [place] Caucasian/White             Reg Status PCP Date Last Verified Next Review Date     Verified Auston Reyes BIRCH [phone] 04/22/24 05/22/24           Marital Status Religion Language       Married Unknown-Patient Declined English              EMERGENCY CONTACT: Name Relationship Lgl Grd Work Administrator, sports  1. Rockholt,ROBIN Spouse   [phone]    GUARANTOR: There is no guarantor information entered for this encounter.  COVERAGE: Primary Visit Coverage      Payer Plan Group Number Group Name Payer Phone Plan Phone   No coverage found                Secondary Visit Coverage      Payer Plan Group Number Group Name Payer Phone Plan Phone   No coverage found                Primary Coverage      Payer Plan Group Number Group Name Payer Phone Plan Phone   [facility] MEDICARE CAROLENE SABLE CHUTE [facility] CHOICE [id number] [facility] BENEFIT PLAN [place]UG[place]O SINGER  [phone]           Primary Subscriber      Subscriber ID Subscriber Name Subscriber [facility] Subscriber Address   [id number] [name],[name] [id number] [address]      [place], [place] [address]           Secondary Coverage      Payer Plan Group Number Group Name Payer Phone Plan Phone   TRICARE TRICARE FOR LIFE    [phone]           Secondary Subscriber      Subscriber ID Subscriber Name Subscriber [facility] Subscriber Address   [id number] [name],[name] [id number]       [place], [place] [address]

## 2024-05-25 ENCOUNTER — Encounter: Payer: Self-pay | Admitting: Oncology

## 2024-05-25 ENCOUNTER — Inpatient Hospital Stay: Attending: Oncology | Admitting: Oncology

## 2024-05-25 ENCOUNTER — Inpatient Hospital Stay

## 2024-05-25 VITALS — BP 158/67 | HR 64 | Temp 96.2°F | Resp 18 | Wt 167.0 lb

## 2024-05-25 DIAGNOSIS — D649 Anemia, unspecified: Secondary | ICD-10-CM | POA: Insufficient documentation

## 2024-05-25 DIAGNOSIS — Z87891 Personal history of nicotine dependence: Secondary | ICD-10-CM | POA: Insufficient documentation

## 2024-05-25 DIAGNOSIS — Z79899 Other long term (current) drug therapy: Secondary | ICD-10-CM | POA: Diagnosis not present

## 2024-05-25 LAB — LACTATE DEHYDROGENASE: LDH: 148 U/L (ref 98–192)

## 2024-05-25 LAB — CBC (CANCER CENTER ONLY)
HCT: 41.4 % (ref 39.0–52.0)
Hemoglobin: 13.9 g/dL (ref 13.0–17.0)
MCH: 29.9 pg (ref 26.0–34.0)
MCHC: 33.6 g/dL (ref 30.0–36.0)
MCV: 89 fL (ref 80.0–100.0)
Platelet Count: 221 K/uL (ref 150–400)
RBC: 4.65 MIL/uL (ref 4.22–5.81)
RDW: 12.8 % (ref 11.5–15.5)
WBC Count: 5.2 K/uL (ref 4.0–10.5)
nRBC: 0 % (ref 0.0–0.2)

## 2024-05-25 LAB — FOLATE: Folate: 32 ng/mL (ref >5.9)

## 2024-05-25 NOTE — Progress Notes (Signed)
 [facility]  Telephone:(336) [phone] Fax:(336) [phone]  ID: Dalton Franklin OB: 11/11/1940  MR#: [id number]  RDW#:[id number]  Patient Care Team: Auston Reyes BIRCH, MD as PCP - General (Internal Medicine) Jacobo Dalton PARAS, MD as Consulting Physician (Oncology)  CHIEF COMPLAINT: Anemia, unspecified.  INTERVAL HISTORY: Patient is an 83 year old male who is noted to have a slowly declining hemoglobin over the past several years.  He was referred for further evaluation.  He currently feels well and is asymptomatic.  He does not complain of any weakness or fatigue.  He has a good appetite and denies weight loss.  He denies any recent fevers or illnesses.  He has no chest pain, shortness of breath, cough, or hemoptysis.  He denies any nausea, vomiting, constipation, or diarrhea.  He has no melena or hematochezia.  He has no urinary complaints.  Patient offers no specific complaints today.  REVIEW OF SYSTEMS:   Review of Systems  Constitutional: Negative.  Negative for fever, malaise/fatigue and weight loss.  Respiratory: Negative.  Negative for cough, hemoptysis and shortness of breath.   Cardiovascular: Negative.  Negative for chest pain and leg swelling.  Gastrointestinal: Negative.  Negative for abdominal pain, blood in stool and melena.  Genitourinary: Negative.  Negative for dysuria.  Musculoskeletal: Negative.  Negative for back pain.  Skin: Negative.  Negative for rash.  Neurological: Negative.  Negative for dizziness, focal weakness, weakness and headaches.  Psychiatric/Behavioral: Negative.  The patient is not nervous/anxious.     As per HPI. Otherwise, a complete review of systems is negative.  PAST MEDICAL HISTORY: Past Medical History:  Diagnosis Date   BPH (benign prostatic hyperplasia)    Ganglion cyst of flexor tendon sheath of finger of left hand    Hyperlipidemia     PAST SURGICAL HISTORY: Past Surgical History:  Procedure Laterality Date    COLONOSCOPY WITH PROPOFOL  N/A 09/14/2015   Procedure: COLONOSCOPY WITH PROPOFOL ;  Surgeon: Donnice Vaughn Manes, MD;  Location: [facility] ENDOSCOPY;  Service: Endoscopy;  Laterality: N/A;   TONSILLECTOMY      FAMILY HISTORY: Family History  Problem Relation Age of Onset   Prostate cancer Neg Hx    Kidney cancer Neg Hx    Bladder Cancer Neg Hx     ADVANCED DIRECTIVES (Y/N):  N  HEALTH MAINTENANCE: Social History   Tobacco Use   Smoking status: Former   Smokeless tobacco: Never  Substance Use Topics   Alcohol use: No   Drug use: No     Colonoscopy:  PAP:  Bone density:  Lipid panel:  Allergies  Allergen Reactions   Meloxicam    Other     Other reaction(s): Unknown Antihistamines:  Urinary retention Antihistamines:  Urinary retention  Other reaction(s): Unknown Antihistamines:  Urinary retention Other reaction(s): Unknown Antihistamines:  Urinary retention Antihistamines:  Urinary retention   Soma [Carisoprodol]     Current Outpatient Medications  Medication Sig Dispense Refill   atorvastatin (LIPITOR) 20 MG tablet Take 20 mg by mouth daily.     Cholecalciferol 25 MCG (1000 UT) tablet Take by mouth.     ibuprofen (ADVIL,MOTRIN) 200 MG tablet Take 200 mg by mouth every 6 (six) hours as needed.     Multiple Vitamin (MULTI-VITAMINS) TABS Take by mouth.     niacin (NIASPAN) 1000 MG CR tablet Take 1,000 mg by mouth at bedtime.     finasteride  (PROSCAR ) 5 MG tablet Take 1 tablet (5 mg total) by mouth daily. (Patient not taking: Reported on 05/25/2024)  90 tablet 3   tamsulosin  (FLOMAX ) 0.4 MG CAPS capsule Take 2 capsules (0.8 mg total) by mouth daily. 120 capsule 5   vitamin B-12 (CYANOCOBALAMIN) 1000 MCG tablet Take by mouth. (Patient not taking: Reported on 05/25/2024)     No current facility-administered medications for this visit.    OBJECTIVE: Vitals:   05/25/24 1147  BP: (!) 158/67  Pulse: 64  Resp: 18  Temp: (!) 96.2 F (35.7 C)  SpO2: 96%     Body mass index  is 26.95 kg/m.    ECOG FS:0 - Asymptomatic  General: Well-developed, well-nourished, no acute distress. Eyes: Pink conjunctiva, anicteric sclera. HEENT: Normocephalic, moist mucous membranes. Lungs: No audible wheezing or coughing. Heart: Regular rate and rhythm. Abdomen: Soft, nontender, no obvious distention. Musculoskeletal: No edema, cyanosis, or clubbing. Neuro: Alert, answering all questions appropriately. Cranial nerves grossly intact. Skin: No rashes or petechiae noted. Psych: Normal affect. Lymphatics: No cervical, calvicular, axillary or inguinal LAD.   LAB RESULTS:  Lab Results  Component Value Date   NA 135 08/31/2016   K 4.2 08/31/2016   CL 104 08/31/2016   CO2 24 08/31/2016   GLUCOSE 77 08/31/2016   BUN 21 (H) 08/31/2016   CREATININE 1.17 08/31/2016   CALCIUM 9.6 08/31/2016   GFRNONAA 59 (L) 08/31/2016   GFRAA >60 08/31/2016    Lab Results  Component Value Date   WBC 5.2 05/25/2024   NEUTROABS 7.5 (H) 08/31/2016   HGB 13.9 05/25/2024   HCT 41.4 05/25/2024   MCV 89.0 05/25/2024   PLT 221 05/25/2024     STUDIES: No results found.  ASSESSMENT: Anemia, unspecified.  PLAN:    Anemia, unspecified: Patient's hemoglobin is within normal limits today at 13.9.  Previously, patient's iron panel and B12 levels were also within normal limits.  Folic acid is within normal limits.  He has no evidence of hemolysis.  SPEP and IntelliGEN myeloid panel were ordered for completeness and are pending at time of dictation.  No intervention is needed at this time.  Patient does not require bone marrow biopsy.  He will have a video-assisted telemedicine in 3 weeks to discuss his results.  I spent a total of 45 minutes reviewing chart data, face-to-face evaluation with the patient, counseling and coordination of care as detailed above.   Patient expressed understanding and was in agreement with this plan. He also understands that He can call clinic at any time with any  questions, concerns, or complaints.    Dalton JINNY Reusing, MD   05/26/2024 11:27 AM

## 2024-05-25 NOTE — Progress Notes (Signed)
 Patient has been noticing that he has been slowing down recently. He has early stages of parkinson disease.

## 2024-05-26 LAB — HAPTOGLOBIN: Haptoglobin: 165 mg/dL (ref 38–329)

## 2024-05-27 LAB — PROTEIN ELECTROPHORESIS, SERUM
A/G Ratio: 1.2 (ref 0.7–1.7)
Albumin ELP: 3.8 g/dL (ref 2.9–4.4)
Alpha-1-Globulin: 0.3 g/dL (ref 0.0–0.4)
Alpha-2-Globulin: 0.7 g/dL (ref 0.4–1.0)
Beta Globulin: 0.9 g/dL (ref 0.7–1.3)
Gamma Globulin: 1.2 g/dL (ref 0.4–1.8)
Globulin, Total: 3.1 g/dL (ref 2.2–3.9)
M-Spike, %: 0.7 g/dL — ABNORMAL HIGH
Total Protein ELP: 6.9 g/dL (ref 6.0–8.5)

## 2024-06-08 ENCOUNTER — Telehealth: Payer: Self-pay | Admitting: Oncology

## 2024-06-08 NOTE — Telephone Encounter (Signed)
 Pt called and stated he had a missed call from [place] but there was no message so he did not know what it was about.

## 2024-06-15 ENCOUNTER — Telehealth: Admitting: Oncology

## 2024-06-15 ENCOUNTER — Inpatient Hospital Stay: Attending: Oncology | Admitting: Oncology

## 2024-06-15 DIAGNOSIS — Z87891 Personal history of nicotine dependence: Secondary | ICD-10-CM | POA: Diagnosis not present

## 2024-06-15 DIAGNOSIS — Z862 Personal history of diseases of the blood and blood-forming organs and certain disorders involving the immune mechanism: Secondary | ICD-10-CM

## 2024-06-15 DIAGNOSIS — D472 Monoclonal gammopathy: Secondary | ICD-10-CM | POA: Diagnosis not present

## 2024-06-15 NOTE — Progress Notes (Signed)
 [facility]  Telephone:(336) [phone] Fax:(336) [phone]  ID: Dalton Franklin OB: 23-Oct-1940  MR#: [id number]  RDW#:[id number]  Patient Care Team: Auston Reyes BIRCH, MD as PCP - General (Internal Medicine) Jacobo Evalene PARAS, MD as Consulting Physician (Oncology)  I connected with Dalton Franklin on 06/15/24 at 10:15 AM EDT by video enabled telemedicine visit and verified that I am speaking with the correct person using two identifiers.   I discussed the limitations, risks, security and privacy concerns of performing an evaluation and management service by telemedicine and the availability of in-person appointments. I also discussed with the patient that there may be a patient responsible charge related to this service. The patient expressed understanding and agreed to proceed.   Other persons participating in the visit and their role in the encounter: Patient, MD.  Patient's location: Home. Provider's location: Clinic.  CHIEF COMPLAINT: MGUS.  INTERVAL HISTORY: Patient agreed to video-assisted telemedicine visit for further evaluation and discussion of his laboratory results.  He continues to feel well and remains asymptomatic.  He does not complain of any weakness or fatigue.  He has a good appetite and denies weight loss.  He denies any recent fevers or illnesses.  He has no chest pain, shortness of breath, cough, or hemoptysis.  He denies any nausea, vomiting, constipation, or diarrhea.  He has no melena or hematochezia.  He has no urinary complaints.  Patient offers no specific complaints today.  REVIEW OF SYSTEMS:   Review of Systems  Constitutional: Negative.  Negative for fever, malaise/fatigue and weight loss.  Respiratory: Negative.  Negative for cough, hemoptysis and shortness of breath.   Cardiovascular: Negative.  Negative for chest pain and leg swelling.  Gastrointestinal: Negative.  Negative for abdominal pain, blood in stool and melena.  Genitourinary:  Negative.  Negative for dysuria.  Musculoskeletal: Negative.  Negative for back pain.  Skin: Negative.  Negative for rash.  Neurological: Negative.  Negative for dizziness, focal weakness, weakness and headaches.  Psychiatric/Behavioral: Negative.  The patient is not nervous/anxious.     As per HPI. Otherwise, a complete review of systems is negative.  PAST MEDICAL HISTORY: Past Medical History:  Diagnosis Date   BPH (benign prostatic hyperplasia)    Ganglion cyst of flexor tendon sheath of finger of left hand    Hyperlipidemia     PAST SURGICAL HISTORY: Past Surgical History:  Procedure Laterality Date   COLONOSCOPY WITH PROPOFOL  N/A 09/14/2015   Procedure: COLONOSCOPY WITH PROPOFOL ;  Surgeon: Donnice Vaughn Manes, MD;  Location: [facility] ENDOSCOPY;  Service: Endoscopy;  Laterality: N/A;   TONSILLECTOMY      FAMILY HISTORY: Family History  Problem Relation Age of Onset   Prostate cancer Neg Hx    Kidney cancer Neg Hx    Bladder Cancer Neg Hx     ADVANCED DIRECTIVES (Y/N):  N  HEALTH MAINTENANCE: Social History   Tobacco Use   Smoking status: Former   Smokeless tobacco: Never  Substance Use Topics   Alcohol use: No   Drug use: No     Colonoscopy:  PAP:  Bone density:  Lipid panel:  Allergies  Allergen Reactions   Meloxicam    Other     Other reaction(s): Unknown Antihistamines:  Urinary retention Antihistamines:  Urinary retention  Other reaction(s): Unknown Antihistamines:  Urinary retention Other reaction(s): Unknown Antihistamines:  Urinary retention Antihistamines:  Urinary retention   Soma [Carisoprodol]     Current Outpatient Medications  Medication Sig Dispense Refill   atorvastatin (  LIPITOR) 20 MG tablet Take 20 mg by mouth daily.     Cholecalciferol 25 MCG (1000 UT) tablet Take by mouth.     finasteride  (PROSCAR ) 5 MG tablet Take 1 tablet (5 mg total) by mouth daily. (Patient not taking: Reported on 05/25/2024) 90 tablet 3   ibuprofen  (ADVIL,MOTRIN) 200 MG tablet Take 200 mg by mouth every 6 (six) hours as needed.     Multiple Vitamin (MULTI-VITAMINS) TABS Take by mouth.     niacin (NIASPAN) 1000 MG CR tablet Take 1,000 mg by mouth at bedtime.     tamsulosin  (FLOMAX ) 0.4 MG CAPS capsule Take 2 capsules (0.8 mg total) by mouth daily. 120 capsule 5   vitamin B-12 (CYANOCOBALAMIN) 1000 MCG tablet Take by mouth. (Patient not taking: Reported on 05/25/2024)     No current facility-administered medications for this visit.    OBJECTIVE: There were no vitals filed for this visit.    There is no height or weight on file to calculate BMI.    ECOG FS:0 - Asymptomatic  General: Well-developed, well-nourished, no acute distress. HEENT: Normocephalic. Neuro: Alert, answering all questions appropriately. Cranial nerves grossly intact. Psych: Normal affect.  LAB RESULTS:  Lab Results  Component Value Date   NA 135 08/31/2016   K 4.2 08/31/2016   CL 104 08/31/2016   CO2 24 08/31/2016   GLUCOSE 77 08/31/2016   BUN 21 (H) 08/31/2016   CREATININE 1.17 08/31/2016   CALCIUM 9.6 08/31/2016   GFRNONAA 59 (L) 08/31/2016   GFRAA >60 08/31/2016    Lab Results  Component Value Date   WBC 5.2 05/25/2024   NEUTROABS 7.5 (H) 08/31/2016   HGB 13.9 05/25/2024   HCT 41.4 05/25/2024   MCV 89.0 05/25/2024   PLT 221 05/25/2024     STUDIES: No results found.  ASSESSMENT: MGUS.  PLAN:    MGUS: Patient noted to have a mildly increased M spike of 0.7.  He has no evidence of endorgan damage and all of his other laboratory work is either negative or within normal limits.  No intervention is needed at this time.  Patient does not require imaging or bone marrow biopsy.  Return to clinic in 6 months with repeat laboratory work with full myeloma panel and then video-assisted telemedicine visit 1 week later. Anemia, unspecified: Resolved.  Patient's hemoglobin is within normal limits.  Other than the patient's SPEP, all of her other  laboratory work is either negative or within normal limits.  IntelliGEN myeloid panel is pending at time of dictation.  No intervention is needed at this time.  Follow-up as above.  I provided 20 minutes of face-to-face video visit time during this encounter which included chart review, counseling, and coordination of care as documented above.    Patient expressed understanding and was in agreement with this plan. He also understands that He can call clinic at any time with any questions, concerns, or complaints.    [name], MD   06/15/2024 1:35 PM

## 2024-06-15 NOTE — Progress Notes (Signed)
 Tried to call patient in regards to their MyChart visit with Dr. Jacobo, but no answer.

## 2024-06-21 LAB — INTELLIGEN MYELOID
